# Patient Record
Sex: Female | Born: 1950 | Race: White | Hispanic: No | Smoking: Never smoker
Health system: Southern US, Community
[De-identification: ages and names within clinical notes are randomized; demographics above are authoritative.]

## PROBLEM LIST (undated history)

## (undated) DIAGNOSIS — I10 Essential (primary) hypertension: Secondary | ICD-10-CM

## (undated) DIAGNOSIS — I499 Cardiac arrhythmia, unspecified: Secondary | ICD-10-CM

## (undated) DIAGNOSIS — M1990 Unspecified osteoarthritis, unspecified site: Secondary | ICD-10-CM

## (undated) DIAGNOSIS — J45909 Unspecified asthma, uncomplicated: Secondary | ICD-10-CM

---

## 2018-11-07 HISTORY — PX: COLONOSCOPY: SHX174

## 2021-08-18 ENCOUNTER — Ambulatory Visit (INDEPENDENT_AMBULATORY_CARE_PROVIDER_SITE_OTHER): Payer: Medicare Other | Admitting: Orthopedic Surgery

## 2021-08-18 ENCOUNTER — Encounter: Payer: Self-pay | Admitting: Orthopedic Surgery

## 2021-08-18 ENCOUNTER — Ambulatory Visit: Payer: Self-pay

## 2021-08-18 ENCOUNTER — Other Ambulatory Visit: Payer: Self-pay

## 2021-08-18 DIAGNOSIS — G8929 Other chronic pain: Secondary | ICD-10-CM

## 2021-08-18 DIAGNOSIS — M25511 Pain in right shoulder: Secondary | ICD-10-CM

## 2021-08-18 NOTE — Progress Notes (Signed)
Office Visit Note   Patient: Elizabeth [name]           Date of Birth: 1951-07-11           MRN: [id number] Visit Date: 08/18/2021 Requested by: Rolan Bucco., PA-C [address] [place],  [place] [address] PCP: Rolan Bucco., PA-C  Subjective: Chief Complaint  Patient presents with   Right Shoulder - Pain    HPI: Elizabeth [name] is a 70 year old patient with right shoulder pain.  She has had shoulder pain for about a year and a half.  Pain is worse in the right-hand side but she also has some left shoulder symptoms.  Hard for her to get her hands up behind her head.  Hard for her to reach dishes in the cabinet.  She states her range of motion is limited by pain.  She tried anti-inflammatories but it increased her blood pressure.  Denies any neck pain or radicular symptoms.  Heating pad gives her some relief in the morning.  She also has used Voltaren gel.  She has not had any injections.  Rates the pain as 9 out of 10 during the day.  She does have her granddaughter at home with her.              ROS: All systems reviewed are negative as they relate to the chief complaint within the history of present illness.  Patient denies  fevers or chills.   Assessment & Plan: Visit Diagnoses:  1. 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  Hard to accurately assess her strength due to the loss of motion.  I do not think this is a type of shoulder that would do well for any length of time with an injection.  Discussed operative and nonoperative treatment options for low this.  In general we need a thin cut CT scan to evaluate whether or not there is enough glenoid bone stock to perform reverse replacement.  She is otherwise healthy.  Thin cut CT scan pending for patient specific instrumentation for reverse shoulder replacement.  Follow-up after that study.  Follow-Up Instructions: Return for after MRI.   Orders:  Orders  Placed This Encounter  Procedures   XR Shoulder Right   CT SHOULDER RIGHT WO CONTRAST   No orders of the defined types were placed in this encounter.     Procedures: No procedures performed   Clinical Data: No additional findings.  Objective: Vital Signs: There were no vitals taken for this visit.  Physical Exam:   Constitutional: Patient appears well-developed HEENT:  Head: Normocephalic Eyes:EOM are normal Neck: Normal range of motion Cardiovascular: Normal rate Pulmonary/chest: Effort normal Neurologic: Patient is alert Skin: Skin is warm Psychiatric: Patient has normal mood and affect   Ortho Exam: Ortho exam demonstrates full active and passive range of motion of the cervical spine.  5 out of 5 grip EPL FPL interosseous wrist flexion extension bicep triceps and deltoid strength.  Passive range of motion on the right is 15/40/80.  Passive range of motion on the left is 25/50/90.  Rotator cuff strength is difficult to assess on the right but feels reasonably intact.  No masses lymphadenopathy or skin changes noted in that shoulder girdle region.  Radial pulse is intact.  Specialty Comments:  No specialty comments available.  Imaging: No results found.   PMFS History: There are no problems to display for this patient.  History  reviewed. No pertinent past medical history.  History reviewed. No pertinent family history.  History reviewed. No pertinent surgical history. Social History   Occupational History   Not on file  Tobacco Use   Smoking status: Not on file   Smokeless tobacco: Not on file  Substance and Sexual Activity   Alcohol use: Not on file   Drug use: Not on file   Sexual activity: Not on file

## 2021-09-09 ENCOUNTER — Ambulatory Visit
Admission: RE | Admit: 2021-09-09 | Discharge: 2021-09-09 | Disposition: A | Discharge status: Home / Self Care | Payer: Medicare Other | Source: Ambulatory Visit | Attending: Orthopedic Surgery | Admitting: Orthopedic Surgery

## 2021-09-09 ENCOUNTER — Other Ambulatory Visit: Payer: Self-pay

## 2021-09-09 DIAGNOSIS — M25511 Pain in right shoulder: Secondary | ICD-10-CM

## 2021-09-13 ENCOUNTER — Ambulatory Visit (INDEPENDENT_AMBULATORY_CARE_PROVIDER_SITE_OTHER): Payer: Medicare Other | Admitting: Orthopedic Surgery

## 2021-09-13 ENCOUNTER — Other Ambulatory Visit: Payer: Self-pay

## 2021-09-13 DIAGNOSIS — M19011 Primary osteoarthritis, right shoulder: Secondary | ICD-10-CM | POA: Diagnosis not present

## 2021-09-19 ENCOUNTER — Encounter: Payer: Self-pay | Admitting: Orthopedic Surgery

## 2021-09-19 NOTE — Progress Notes (Signed)
   Office Visit Note   Patient: Elizabeth [name]           Date of Birth: 1951-10-09           MRN: [id number] Visit Date: 09/13/2021 Requested by: Rolan Bucco., PA-C [address] [place],  [place] [address] PCP: Rolan Bucco., PA-C  Subjective: Chief Complaint  Patient presents with   Other     Scan review    HPI: Patient presents for evaluation of right shoulder pain.  Since she was last seen she has had a CT scan of the right shoulder which shows severe arthritis along with narrowing of the acromiohumeral distance consistent with end-stage rotator cuff arthropathy.  She has limited range of motion of the shoulder as well.  She stays with her granddaughter but most of the time she is not there.  Overall she is functional enough with the shoulder that she wants to continue with that amount of functional disability.              ROS: All systems reviewed are negative as they relate to the chief complaint within the history of present illness.  Patient denies  fevers or chills.   Assessment & Plan: Visit Diagnoses:  1. 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  The risk and benefits of that procedure are discussed with the patient including not limited to infection nerve vessel damage instability as well as the prolonged recovery required.  In general patient has significant functional limitation with the shoulder but is managing well enough that she wants to continue without intervention for now.  She may change her mind.  Encouraged her to come back if she wanted to discuss more about reverse shoulder replacement.  All questions answered about the procedure.  Follow-Up Instructions: No follow-ups on file.   Orders:  No orders of the defined types were placed in this encounter.  No orders of the defined types were placed in this  encounter.     Procedures: No procedures performed   Clinical Data: No additional findings.  Objective: Vital Signs: There were no vitals taken for this visit.  Physical Exam:   Constitutional: Patient appears well-developed HEENT:  Head: Normocephalic Eyes:EOM are normal Neck: Normal range of motion Cardiovascular: Normal rate Pulmonary/chest: Effort normal Neurologic: Patient is alert Skin: Skin is warm Psychiatric: Patient has normal mood and affect   Ortho Exam: Ortho exam demonstrates functional deltoid but limited range of motion.  This is unchanged from the prior note.  Right shoulder has crepitus with range of motion.  Motor or sensory function to the hand is intact.  No other masses lymphadenopathy or skin changes noted in the shoulder girdle region.  Specialty Comments:  No specialty comments available.  Imaging: No results found.   PMFS History: There are no problems to display for this patient.  No past medical history on file.  No family history on file.  No past surgical history on file. Social History   Occupational History   Not on file  Tobacco Use   Smoking status: Not on file   Smokeless tobacco: Not on file  Substance and Sexual Activity   Alcohol use: Not on file   Drug use: Not on file   Sexual activity: Not on file

## 2022-01-26 NOTE — Progress Notes (Signed)
Surgical Instructions ? ? ? Your procedure is scheduled on Thursday, March 30th. ? Report to [facility] Main Entr[facility] "A" at 10:15 A.M., then check in with the Admitting office. ? Call this number if you have problems the morning of surgery: ? [phone] ? ? If you have any questions prior to your surgery date call [phone]: Open Monday-Friday 8am-4pm ? ? ? Remember: ? Do not eat after midnight the night before your surgery ? ?You may drink clear liquids until 9:15 AM the morning of your surgery.   ?Clear liquids allowed are: Water, Non-Citrus Juices (without pulp), Carbonated Beverages, Clear Tea, Black Coffee ONLY (NO MILK, CREAM OR POWDERED CREAMER of any kind), and Gatorade ? ?Please complete your PRE-SURGERY ENSURE that was provided to you by 9:15 AM the morning of surgery.  Please, if able, drink it in one sitting. DO NOT SIP. Nothing else to drink once you finish the Ensure.  ? ?  ? Take these medicines the morning of surgery with A SIP OF WATER:  ? NONE ? ? ?As of today, STOP taking any Aspirin (unless otherwise instructed by your surgeon) Aleve, Naproxen, Ibuprofen, Motrin, Advil, Goody's, BC's, all herbal medications, fish oil, and all vitamins. ? ?         ? DAY OF SURGERY: ?Do not wear jewelry or makeup ?Do not wear lotions, powders, perfumes, or deodorant. ?Do not shave 48 hours prior to surgery.   ?Do not bring valuables to the hospital. ?Do not wear nail polish, gel polish, artificial nails, or any other type of covering on natural nails (fingers and toes) ?If you have artificial nails or gel coating that need to be removed by a nail salon, please have this removed prior to surgery. Artificial nails or gel coating may interfere with anesthesia's ability to adequately monitor your vital signs. ? ?[facility] is not responsible for any belongings or valuables. .  ? ?Do NOT Smoke (Tobacco/Vaping)  24 hours prior to your procedure ? ?If you use a CPAP at night, you may bring your mask for your  overnight stay. ?  ?Contacts, glasses, hearing aids, dentures or partials may not be worn into surgery, please bring cases for these belongings ?  ?For patients admitted to the hospital, discharge time will be determined by your treatment team. ?  ?Patients discharged the day of surgery will not be allowed to drive home, and someone needs to stay with them for 24 hours. ? ? ?SURGICAL WAITING ROOM VISITATION ?Patients having surgery or a procedure in a hospital may have two support people. ?Children under the age of 69 must have an adult with them who is not the patient. ?They may stay in the waiting area during the procedure and may switch out with other visitors. If the patient needs to stay at the hospital during part of their recovery, the visitor guidelines for inpatient rooms apply. ? ?Please refer to the [facility] website for the visitor guidelines for Inpatients (after your surgery is over and you are in a regular room).  ? ? ?                                 [facility]- Preparing for Total Shoulder Arthroplasty  ? ?Before surgery, you can play an important role. Because skin is not sterile, your skin needs to be as free of germs as possible. You can reduce the number of germs on your skin  by using the following products. ?Benzoyl Peroxide Gel ?Reduces the number of germs present on the skin ?Applied twice a day to shoulder area starting two days before surgery   ?Chlorhexidine Gluconate (CHG) Soap ?An antiseptic cleaner that kills germs and bonds with the skin to continue killing germs even after washing ?Used for showering the night before surgery and morning of surgery ?  ?Oral Hygiene is also important to reduce your risk of infection.                                    ?Remember - BRUSH YOUR TEETH THE MORNING OF SURGERY WITH YOUR REGULAR TOOTHPASTE ? ?================================================================== ? ?Please follow these instructions carefully: ? ?BENZOYL PEROXIDE 5% GEL ? ?Please do  not use if you have an allergy to benzoyl peroxide.   If your skin becomes reddened/irritated stop using the benzoyl peroxide. ? ?Starting two days before surgery, apply as follows: ?Apply benzoyl peroxide in the morning and at night. Apply after taking a shower. If you are not taking a shower clean entire shoulder front, back, and side along with the armpit with a clean wet washcloth. ? ?Place a quarter-sized dollop on your shoulder and rub in thoroughly, making sure to cover the front, back, and side of your shoulder, along with the armpit.  ? ?2 days before ____ AM   ____ PM              1 day before ____ AM   ____ PM ? ?                        ? Do this twice a day for two days.  (Last application is the night before surgery, AFTER using the CHG soap as described below). ? ?Do NOT apply benzoyl peroxide gel on the day of surgery. ? ?CHLORHEXIDINE GLUCONATE (CHG) SOAP ? ?Please do not use if you have an allergy to CHG or antibacterial soaps. If your skin becomes reddened/irritated stop using the CHG.  ? ?Do not shave (including legs and underarms) for at least 48 hours prior to first CHG shower. It is OK to shave your face. ? ?Starting the night before surgery, use CHG soap as follows: ? ?Shower the NIGHT BEFORE SURGERY and MORNING OF SURGERY with CHG. ? ?If you choose to wash your hair, wash your hair first as usual with your normal shampoo. ? ?After shampooing, rinse your hair and body thoroughly to remove the shampoo. ? ?Use CHG as you would any other liquid soap.  You can apply CHG directly to the skin and wash gently with a scrungie or a clean washcloth. ? ?Apply the CHG soap to your body ONLY FROM THE NECK DOWN.  Do not use on open wounds or open sores.  Avoid contact with your eyes, ears, mouth, and genitals (private parts).  Wash face and genitals (private parts) with your normal soap. ? ?Wash thoroughly, paying special attention to the area where your surgery will be performed. ? ?Thoroughly rinse your  body with warm water from the neck down. ? ?DO NOT shower/wash with your normal soap after using and rinsing off the CHG soap. ? ? ?Pat yourself dry with a CLEAN TOWEL.  ? ? Apply benzoyl peroxide.  ? ?Wear CLEAN PAJAMAS to bed the night before surgery; wear comfortable clothes the morning of surgery. ? ?Place CLEAN SHEETS on your  bed the night of your first shower and DO NOT SLEEP WITH PETS. ? ?Day of Surgery: ?Shower as above ?Do not apply any deodorants/lotions.  ?Please wear clean clothes to the hospital/surgery center.   ?Remember to brush your teeth WITH YOUR REGULAR TOOTHPASTE.   ? ? ?If you received a COVID test during your pre-op visit  it is requested that you wear a mask when out in public, stay away from anyone that may not be feeling well and notify your surgeon if you develop symptoms. If you have been in contact with anyone that has tested positive in the last 10 days please notify you surgeon. ? ?  ?Please read over the following fact sheets that you were given.  ? ?

## 2022-01-27 ENCOUNTER — Inpatient Hospital Stay (HOSPITAL_COMMUNITY)
Admission: RE | Admit: 2022-01-27 | Discharge: 2022-01-27 | Disposition: A | Discharge status: Home / Self Care | Payer: Medicare Other | Source: Ambulatory Visit

## 2022-02-03 ENCOUNTER — Ambulatory Visit (HOSPITAL_COMMUNITY): Admission: RE | Admit: 2022-02-03 | Payer: Medicare Other | Source: Home / Self Care | Admitting: Orthopedic Surgery

## 2022-02-03 ENCOUNTER — Encounter (HOSPITAL_COMMUNITY): Admission: RE | Payer: Self-pay | Source: Home / Self Care

## 2022-02-03 DIAGNOSIS — Z01818 Encounter for other preprocedural examination: Secondary | ICD-10-CM

## 2022-02-03 SURGERY — ARTHROPLASTY, SHOULDER, TOTAL, REVERSE
Anesthesia: General | Site: Shoulder | Laterality: Right

## 2022-02-17 ENCOUNTER — Encounter: Payer: Medicare Other | Admitting: Orthopedic Surgery

## 2022-04-01 ENCOUNTER — Telehealth: Payer: Self-pay | Admitting: Orthopedic Surgery

## 2022-04-01 NOTE — Telephone Encounter (Signed)
Patient's right reverse shoulder arthroplasty for 04-12-22 with Dr. August Saucer has been cancelled.  The implant model has expired. Surgery would need to have taken place by 04-11-22 in order to proceed with surgery.  I spoke with patient and she  has decided on a  rescheduled date of 07-07-22. A new referral for a scan will need to placed.  Can you please let me know when this has been done, so I can contact [employer] to make sure the images are pushed to Josh.

## 2022-04-05 ENCOUNTER — Other Ambulatory Visit: Payer: Self-pay

## 2022-04-05 DIAGNOSIS — M19011 Primary osteoarthritis, right shoulder: Secondary | ICD-10-CM

## 2022-04-06 ENCOUNTER — Other Ambulatory Visit (HOSPITAL_COMMUNITY): Payer: Medicare Other

## 2022-04-22 ENCOUNTER — Other Ambulatory Visit: Payer: Medicare Other

## 2022-04-27 ENCOUNTER — Encounter: Payer: Medicare Other | Admitting: Orthopedic Surgery

## 2022-05-04 ENCOUNTER — Other Ambulatory Visit: Payer: Self-pay | Admitting: Orthopedic Surgery

## 2022-05-04 ENCOUNTER — Ambulatory Visit
Admission: RE | Admit: 2022-05-04 | Discharge: 2022-05-04 | Disposition: A | Discharge status: Home / Self Care | Payer: Medicare Other | Source: Ambulatory Visit | Attending: Orthopedic Surgery | Admitting: Orthopedic Surgery

## 2022-05-04 DIAGNOSIS — M19011 Primary osteoarthritis, right shoulder: Secondary | ICD-10-CM

## 2022-05-09 NOTE — Progress Notes (Signed)
Hi Debbie can you get a date for this person since the scan was done.  Thanks

## 2022-05-20 ENCOUNTER — Encounter: Payer: Medicare Other | Admitting: Orthopedic Surgery

## 2022-06-07 ENCOUNTER — Other Ambulatory Visit: Payer: Self-pay

## 2022-07-05 ENCOUNTER — Encounter (HOSPITAL_COMMUNITY): Payer: Self-pay

## 2022-07-05 ENCOUNTER — Other Ambulatory Visit: Payer: Self-pay

## 2022-07-05 ENCOUNTER — Encounter (HOSPITAL_COMMUNITY)
Admission: RE | Admit: 2022-07-05 | Discharge: 2022-07-05 | Disposition: A | Discharge status: Home / Self Care | Payer: Medicare Other | Source: Ambulatory Visit | Attending: Orthopedic Surgery | Admitting: Orthopedic Surgery

## 2022-07-05 VITALS — BP 155/85 | HR 75 | Temp 98.3°F | Resp 17 | Ht 67.0 in | Wt 199.9 lb

## 2022-07-05 DIAGNOSIS — Z01818 Encounter for other preprocedural examination: Secondary | ICD-10-CM | POA: Insufficient documentation

## 2022-07-05 DIAGNOSIS — I2510 Atherosclerotic heart disease of native coronary artery without angina pectoris: Secondary | ICD-10-CM | POA: Insufficient documentation

## 2022-07-05 HISTORY — DX: Essential (primary) hypertension: I10

## 2022-07-05 HISTORY — DX: Cardiac arrhythmia, unspecified: I49.9

## 2022-07-05 HISTORY — DX: Unspecified asthma, uncomplicated: J45.909

## 2022-07-05 HISTORY — DX: Unspecified osteoarthritis, unspecified site: M19.90

## 2022-07-05 LAB — CBC
HCT: 38.1 % (ref 36.0–46.0)
Hemoglobin: 12.6 g/dL (ref 12.0–15.0)
MCH: 32 pg (ref 26.0–34.0)
MCHC: 33.1 g/dL (ref 30.0–36.0)
MCV: 96.7 fL (ref 80.0–100.0)
Platelets: 350 10*3/uL (ref 150–400)
RBC: 3.94 MIL/uL (ref 3.87–5.11)
RDW: 12.2 % (ref 11.5–15.5)
WBC: 8.9 10*3/uL (ref 4.0–10.5)
nRBC: 0 % (ref 0.0–0.2)

## 2022-07-05 LAB — BASIC METABOLIC PANEL
Anion gap: 6 (ref 5–15)
BUN: 15 mg/dL (ref 8–23)
CO2: 26 mmol/L (ref 22–32)
Calcium: 9 mg/dL (ref 8.9–10.3)
Chloride: 107 mmol/L (ref 98–111)
Creatinine, Ser: 0.89 mg/dL (ref 0.44–1.00)
GFR, Estimated: >60 mL/min (ref >60)
Glucose, Bld: 88 mg/dL (ref 70–99)
Potassium: 4.1 mmol/L (ref 3.5–5.1)
Sodium: 139 mmol/L (ref 135–145)

## 2022-07-05 LAB — SURGICAL PCR SCREEN
MRSA, PCR: NEGATIVE
Staphylococcus aureus: NEGATIVE

## 2022-07-05 NOTE — Progress Notes (Signed)
PCP - Osborne Casco, PA with [facility] Cardiologist - Denies  PPM/ICD - Denies Device Orders - n/a Rep Notified - n/a  Chest x-ray - n/a EKG - 07/05/2022 Stress Test - n/a ECHO - n/a Cardiac Cath - n/a   Sleep Study - Denies CPAP - n/a  No DM  Blood Thinner Instructions: n/a Aspirin Instructions: n/a  ERAS Protcol - Yes. Clear liquids until 0915 morning of surgery PRE-SURGERY Ensure or G2- n/a. None ordered  COVID TEST- n/a   Anesthesia review: Yes. Abnormal EKG. Discussed with Shonna Chock, PA-C  Patient denies shortness of breath, fever, cough and chest pain at PAT appointment   All instructions explained to the patient, with a verbal understanding of the material. Patient agrees to go over the instructions while at home for a better understanding. Patient also instructed to self quarantine after being tested for COVID-19. The opportunity to ask questions was provided.

## 2022-07-05 NOTE — Progress Notes (Signed)
Surgical Instructions    Your procedure is scheduled on Thursday, 07/07/22.  Report to [facility] Main Entrance "A" at 10:15 A.M., then check in with the Admitting office.  Call this number if you have problems the morning of surgery:  [phone]   If you have any questions prior to your surgery date call [phone]: Open Monday-Friday 8am-4pm    Remember:  Do not eat after midnight the night before your surgery  You may drink clear liquids until 9:15am the morning of your surgery.   Clear liquids allowed are: Water, Non-Citrus Juices (without pulp), Carbonated Beverages, Clear Tea, Black Coffee ONLY (NO MILK, CREAM OR POWDERED CREAMER of any kind), and Gatorade    Take these medicines the morning of surgery with A SIP OF WATER:  loratadine (CLARITIN) if needed   As of today, STOP taking any Aspirin (unless otherwise instructed by your surgeon) Aleve, Naproxen, Ibuprofen, Motrin, Advil, Goody's, BC's, all herbal medications, fish oil, and all vitamins.           Do not wear jewelry or makeup. Do not wear lotions, powders, perfumes/cologne or deodorant. Do not shave 48 hours prior to surgery.   Do not bring valuables to the hospital. Do not wear nail polish, gel polish, artificial nails, or any other type of covering on natural nails (fingers and toes) If you have artificial nails or gel coating that need to be removed by a nail salon, please have this removed prior to surgery. Artificial nails or gel coating may interfere with anesthesia's ability to adequately monitor your vital signs.  [facility] is not responsible for any belongings or valuables.    Do NOT Smoke (Tobacco/Vaping)  24 hours prior to your procedure  If you use a CPAP at night, you may bring your mask for your overnight stay.   Contacts, glasses, hearing aids, dentures or partials may not be worn into surgery, please bring cases for these belongings   For patients admitted to the hospital, discharge time  will be determined by your treatment team.   Patients discharged the day of surgery will not be allowed to drive home, and someone needs to stay with them for 24 hours.   SURGICAL WAITING ROOM VISITATION Patients having surgery or a procedure may have no more than 2 support people in the waiting area - these visitors may rotate.   Children under the age of 16 must have an adult with them who is not the patient. If the patient needs to stay at the hospital during part of their recovery, the visitor guidelines for inpatient rooms apply. Pre-op nurse will coordinate an appropriate time for 1 support person to accompany patient in pre-op.  This support person may not rotate.   Please refer to the [facility] website for the visitor guidelines for Inpatients (after your surgery is over and you are in a regular room).    Special instructions:    Oral Hygiene is also important to reduce your risk of infection.  Remember - BRUSH YOUR TEETH THE MORNING OF SURGERY WITH YOUR REGULAR TOOTHPASTE   Oral Hygiene is also important to reduce your risk of infection.  Remember - BRUSH YOUR TEETH THE MORNING OF SURGERY WITH YOUR REGULAR TOOTHPASTE  [facility]- Preparing for Total Shoulder Arthroplasty  Before surgery, you can play an important role. Because skin is not sterile, your skin needs to be as free of germs as possible. You can reduce the number of germs on your skin by using the  following products.   Benzoyl Peroxide Gel  o Reduces the number of germs present on the skin  o Applied twice a day to shoulder area starting two days before surgery   Chlorhexidine Gluconate (CHG) Soap (instructions listed above on how to wash with CHG Soap)  o An antiseptic cleaner that kills germs and bonds with the skin to continue killing germs even after washing  o Used for showering the night before surgery and morning of  surgery   ==================================================================  Please follow these instructions carefully:  BENZOYL PEROXIDE 5% GEL  Please do not use if you have an allergy to benzoyl peroxide. If your skin becomes reddened/irritated stop using the benzoyl peroxide.  Starting two days before surgery, apply as follows:  1. Apply benzoyl peroxide in the morning and at night. Apply after taking a shower. If you are not taking a shower clean entire shoulder front, back, and side along with the armpit with a clean wet washcloth.  2. Place a quarter-sized dollop on your SHOULDER and rub in thoroughly, making sure to cover the front, back, and side of your shoulder, along with the armpit.   2 Days prior to Surgery First Dose on Tuesday 07/05/22 Morning Second Dose on Tuesday 8/29/23Night  Day Before Surgery First Dose on Wednesday 07/06/22 Morning Night before surgery wash (entire body except face and private areas) with CHG Soap THEN Second Dose on Wednesday 07/06/22 Night.  Morning of Surgery  wash BODY AGAIN with CHG Soap   4. Do NOT apply benzoyl peroxide gel on the day of surgery   [facility]- Preparing For Surgery  Before surgery, you can play an important role. Because skin is not sterile, your skin needs to be as free of germs as possible. You can reduce the number of germs on your skin by washing with CHG (chlorahexidine gluconate) Soap before surgery.  CHG is an antiseptic cleaner which kills germs and bonds with the skin to continue killing germs even after washing.     Please do not use if you have an allergy to CHG or antibacterial soaps. If your skin becomes reddened/irritated stop using the CHG.  Do not shave (including legs and underarms) for at least 48 hours prior to first CHG shower. It is OK to shave your face.  Please follow these instructions carefully.     Shower the NIGHT BEFORE SURGERY and the MORNING OF SURGERY with CHG Soap.   If you  chose to wash your hair, wash your hair first as usual with your normal shampoo. After you shampoo, rinse your hair and body thoroughly to remove the shampoo.  Then [employer] and genitals (private parts) with your normal soap and rinse thoroughly to remove soap.  After that Use CHG Soap as you would any other liquid soap. You can apply CHG directly to the skin and wash gently with a scrungie or a clean washcloth.   Apply the CHG Soap to your body ONLY FROM THE NECK DOWN.  Do not use on open wounds or open sores. Avoid contact with your eyes, ears, mouth and genitals (private parts). Wash Face and genitals (private parts)  with your normal soap.   Wash thoroughly, paying special attention to the area where your surgery will be performed.  Thoroughly rinse your body with warm water from the neck down.  DO NOT shower/wash with your normal soap after using and rinsing off the CHG Soap.  Pat yourself dry with a CLEAN TOWEL.  8. Apply  the Benzoyl Peroxide only the night before surgery.  Do Not use it the morning of surgery.  Wear CLEAN PAJAMAS to bed the night before surgery  Place CLEAN SHEETS on your bed the night before your surgery  DO NOT SLEEP WITH PETS.   Day of Surgery: Take a shower with CHG soap. Wear Clean/Comfortable clothing the morning of surgery Do not apply any deodorants/lotions.   Remember to brush your teeth WITH YOUR REGULAR TOOTHPASTE.   Please read over the following fact sheets that you were given.

## 2022-07-06 ENCOUNTER — Encounter (HOSPITAL_COMMUNITY): Payer: Self-pay

## 2022-07-06 NOTE — Anesthesia Preprocedure Evaluation (Addendum)
Anesthesia Evaluation  Patient identified by MRN, date of birth, ID band Patient awake    Reviewed: Allergy & Precautions, NPO status , Patient's Chart, lab work & pertinent test results  Airway Mallampati: I  TM Distance: >3 FB Neck ROM: Full    Dental no notable dental hx. (+) Missing, Partial Lower, Partial Upper,    Pulmonary neg pulmonary ROS,    Pulmonary exam normal breath sounds clear to auscultation       Cardiovascular hypertension (not currently on home meds- 170/91 in preop), Normal cardiovascular exam Rhythm:Regular Rate:Normal     Neuro/Psych negative neurological ROS  negative psych ROS   GI/Hepatic negative GI ROS, Neg liver ROS,   Endo/Other  negative endocrine ROS  Renal/GU negative Renal ROS  negative genitourinary   Musculoskeletal  (+) Arthritis , Osteoarthritis,    Abdominal   Peds  Hematology negative hematology ROS (+)   Anesthesia Other Findings   Reproductive/Obstetrics negative OB ROS                           Anesthesia Physical Anesthesia Plan  ASA: 3  Anesthesia Plan: General and Regional   Post-op Pain Management: Regional block* and Tylenol PO (pre-op)*   Induction: Intravenous  PONV Risk Score and Plan: 3 and Ondansetron, Dexamethasone, Midazolam and Treatment may vary due to age or medical condition  Airway Management Planned: Oral ETT  Additional Equipment: None  Intra-op Plan:   Post-operative Plan: Extubation in OR  Informed Consent: I have reviewed the patients History and Physical, chart, labs and discussed the procedure including the risks, benefits and alternatives for the proposed anesthesia with the patient or authorized representative who has indicated his/her understanding and acceptance.     Dental advisory given  Plan Discussed with: CRNA  Anesthesia Plan Comments: (Very hypertensive in preop (SBP 170s), pt doesn't know why  she stopped her BP meds- advised her that she should start taking them again and f/u with PCP postop)     Anesthesia Quick Evaluation

## 2022-07-06 NOTE — Progress Notes (Signed)
Anesthesia Chart Review:  Case: [id number] Date/Time: 07/07/22 1200   Procedure: RIGHT REVERSE SHOULDER ARTHROPLASTY (Right: Shoulder)   Anesthesia type: General   Pre-op diagnosis: right shoulder osteoarthritis   [facility]: [facility] / [facility]   Surgeons: Cammy Copa, MD       DISCUSSION: Patient is a 71 year old female scheduled for the above procedure.  History includes never smoker, HTN, childhood asthma, osteoarthritis. 07/05/22 EKG showed NSR with sinus arrhythmia, non-specific ST abnormality. Patient denied shortness of breath, fever, cough and chest pain per PAT RN interview.   Anesthesia team to evaluate on the day of surgery.     VS: BP (!) 155/85   Pulse 75   Temp 36.8 C (Oral)   Resp 17   Ht 5\' 7"  (1.702 m)   Wt 90.7 kg   SpO2 99%   BMI 31.31 kg/m   PROVIDERS: ., PA-C is PCP [facility])   LABS: Labs reviewed: Acceptable for surgery. (all labs ordered are listed, but only abnormal results are displayed)  Labs Reviewed  SURGICAL PCR SCREEN  BASIC METABOLIC PANEL  CBC     IMAGES: CT Right Shoulder 05/04/22: IMPRESSION: 1. Advanced glenohumeral osteoarthritis with loss of articular cartilage, bone-on-bone articulation, subchondral sclerosis and bulky marginal osteophytes. 2. Full-thickness tear of the supraspinatus tendon and at least full-thickness partial width tear of the infraspinatus tendon. 3. Mild supraspinatus muscle atrophy. No appreciable atrophy of the deltoid. 4.  Moderate acromioclavicular osteoarthritis.   EKG: 07/05/22: Normal sinus rhythm with sinus arrhythmia Nonspecific ST abnormality Abnormal ECG No previous ECGs available Confirmed by 07/07/22 [phone]) on 07/06/2022 7:01:46 AM   CV: N/A  Past Medical History:  Diagnosis Date   Arthritis    Right Shoulder   Asthma    as a child   Dysrhythmia    Hypertension    Hx. Not current per pt    Past Surgical History:  Procedure  Laterality Date   COLONOSCOPY  2020    MEDICATIONS:  lisinopril-hydrochlorothiazide (ZESTORETIC) 20-12.5 MG tablet   loratadine (CLARITIN) 10 MG tablet   Multiple Vitamin (MULTIVITAMIN WITH MINERALS) TABS tablet   No current facility-administered medications for this encounter.    2021, PA-C Surgical Short Stay/Anesthesiology [facility] Phone [phone] [facility] Phone [phone] 07/06/2022 11:24 AM

## 2022-07-07 ENCOUNTER — Encounter (HOSPITAL_COMMUNITY)
Admission: RE | Disposition: A | Discharge status: Home / Self Care | Payer: Self-pay | Source: Home / Self Care | Attending: Orthopedic Surgery

## 2022-07-07 ENCOUNTER — Ambulatory Visit (HOSPITAL_BASED_OUTPATIENT_CLINIC_OR_DEPARTMENT_OTHER): Payer: Medicare Other | Admitting: Anesthesiology

## 2022-07-07 ENCOUNTER — Other Ambulatory Visit: Payer: Self-pay

## 2022-07-07 ENCOUNTER — Ambulatory Visit (HOSPITAL_COMMUNITY): Payer: Medicare Other | Admitting: Vascular Surgery

## 2022-07-07 ENCOUNTER — Observation Stay (HOSPITAL_COMMUNITY)
Admission: RE | Admit: 2022-07-07 | Discharge: 2022-07-08 | Disposition: A | Discharge status: Home / Self Care | Payer: Medicare Other | Attending: Orthopedic Surgery | Admitting: Orthopedic Surgery

## 2022-07-07 ENCOUNTER — Observation Stay (HOSPITAL_COMMUNITY): Payer: Medicare Other

## 2022-07-07 ENCOUNTER — Encounter (HOSPITAL_COMMUNITY): Payer: Self-pay | Admitting: Orthopedic Surgery

## 2022-07-07 DIAGNOSIS — J45909 Unspecified asthma, uncomplicated: Secondary | ICD-10-CM | POA: Insufficient documentation

## 2022-07-07 DIAGNOSIS — I2510 Atherosclerotic heart disease of native coronary artery without angina pectoris: Secondary | ICD-10-CM

## 2022-07-07 DIAGNOSIS — M19011 Primary osteoarthritis, right shoulder: Secondary | ICD-10-CM | POA: Diagnosis present

## 2022-07-07 DIAGNOSIS — Z96611 Presence of right artificial shoulder joint: Secondary | ICD-10-CM

## 2022-07-07 DIAGNOSIS — Z01818 Encounter for other preprocedural examination: Secondary | ICD-10-CM

## 2022-07-07 DIAGNOSIS — I10 Essential (primary) hypertension: Secondary | ICD-10-CM | POA: Diagnosis not present

## 2022-07-07 DIAGNOSIS — Z79899 Other long term (current) drug therapy: Secondary | ICD-10-CM | POA: Diagnosis not present

## 2022-07-07 HISTORY — PX: REVERSE SHOULDER ARTHROPLASTY: SHX5054

## 2022-07-07 LAB — BASIC METABOLIC PANEL
Anion gap: 10 (ref 5–15)
BUN: 10 mg/dL (ref 8–23)
CO2: 25 mmol/L (ref 22–32)
Calcium: 8.9 mg/dL (ref 8.9–10.3)
Chloride: 103 mmol/L (ref 98–111)
Creatinine, Ser: 0.66 mg/dL (ref 0.44–1.00)
GFR, Estimated: >60 mL/min (ref >60)
Glucose, Bld: 172 mg/dL — ABNORMAL HIGH (ref 70–99)
Potassium: 3.9 mmol/L (ref 3.5–5.1)
Sodium: 138 mmol/L (ref 135–145)

## 2022-07-07 LAB — CBC
HCT: 38.9 % (ref 36.0–46.0)
Hemoglobin: 12.8 g/dL (ref 12.0–15.0)
MCH: 32.3 pg (ref 26.0–34.0)
MCHC: 32.9 g/dL (ref 30.0–36.0)
MCV: 98.2 fL (ref 80.0–100.0)
Platelets: 332 K/uL (ref 150–400)
RBC: 3.96 MIL/uL (ref 3.87–5.11)
RDW: 12.2 % (ref 11.5–15.5)
WBC: 15.1 K/uL — ABNORMAL HIGH (ref 4.0–10.5)
nRBC: 0 % (ref 0.0–0.2)

## 2022-07-07 SURGERY — ARTHROPLASTY, SHOULDER, TOTAL, REVERSE
Anesthesia: Regional | Site: Shoulder | Laterality: Right

## 2022-07-07 MED ORDER — CHLORHEXIDINE GLUCONATE 0.12 % MT SOLN
15.0000 mL | Freq: Once | OROMUCOSAL | Status: AC
  Administered 2022-07-07: 15 mL via OROMUCOSAL
  Filled 2022-07-07: qty 15

## 2022-07-07 MED ORDER — POVIDONE-IODINE 7.5 % EX SOLN
Freq: Once | CUTANEOUS | Status: DC
  Filled 2022-07-07: qty 118

## 2022-07-07 MED ORDER — HYDROMORPHONE HCL 1 MG/ML IJ SOLN: 0.5000 mg | INTRAMUSCULAR | Status: DC | PRN

## 2022-07-07 MED ORDER — METHOCARBAMOL 500 MG PO TABS
500.0000 mg | ORAL_TABLET | Freq: Four times a day (QID) | ORAL | Status: DC | PRN
  Administered 2022-07-07 – 2022-07-08 (×2): 500 mg via ORAL
  Filled 2022-07-07 (×2): qty 1

## 2022-07-07 MED ORDER — LIDOCAINE 2% (20 MG/ML) 5 ML SYRINGE
INTRAMUSCULAR | Status: DC | PRN
  Administered 2022-07-07: 40 mg via INTRAVENOUS

## 2022-07-07 MED ORDER — DEXAMETHASONE SODIUM PHOSPHATE 10 MG/ML IJ SOLN
INTRAMUSCULAR | Status: DC | PRN
  Administered 2022-07-07: 10 mg via INTRAVENOUS

## 2022-07-07 MED ORDER — TRANEXAMIC ACID-NACL 1000-0.7 MG/100ML-% IV SOLN
1000.0000 mg | INTRAVENOUS | Status: AC
  Administered 2022-07-07: 1000 mg via INTRAVENOUS
  Filled 2022-07-07: qty 100

## 2022-07-07 MED ORDER — ORAL CARE MOUTH RINSE: 15.0000 mL | Freq: Once | OROMUCOSAL | Status: AC

## 2022-07-07 MED ORDER — PROPOFOL 10 MG/ML IV BOLUS
INTRAVENOUS | Status: DC | PRN
  Administered 2022-07-07: 130 mg via INTRAVENOUS

## 2022-07-07 MED ORDER — PHENYLEPHRINE HCL-NACL 20-0.9 MG/250ML-% IV SOLN
INTRAVENOUS | Status: DC | PRN
  Administered 2022-07-07: 25 ug/min via INTRAVENOUS

## 2022-07-07 MED ORDER — OXYCODONE HCL 5 MG PO TABS: 5.0000 mg | ORAL_TABLET | Freq: Once | ORAL | Status: DC | PRN

## 2022-07-07 MED ORDER — ONDANSETRON HCL 4 MG/2ML IJ SOLN: 4.0000 mg | Freq: Four times a day (QID) | INTRAMUSCULAR | Status: DC | PRN

## 2022-07-07 MED ORDER — DEXAMETHASONE SODIUM PHOSPHATE 10 MG/ML IJ SOLN
INTRAMUSCULAR | Status: AC
  Filled 2022-07-07: qty 1

## 2022-07-07 MED ORDER — FENTANYL CITRATE (PF) 250 MCG/5ML IJ SOLN
INTRAMUSCULAR | Status: AC
  Filled 2022-07-07: qty 5

## 2022-07-07 MED ORDER — ROCURONIUM BROMIDE 10 MG/ML (PF) SYRINGE
PREFILLED_SYRINGE | INTRAVENOUS | Status: DC | PRN
  Administered 2022-07-07: 50 mg via INTRAVENOUS

## 2022-07-07 MED ORDER — ROPIVACAINE HCL 5 MG/ML IJ SOLN
INTRAMUSCULAR | Status: DC | PRN
  Administered 2022-07-07: 15 mL via PERINEURAL

## 2022-07-07 MED ORDER — FENTANYL CITRATE (PF) 100 MCG/2ML IJ SOLN: 25.0000 ug | INTRAMUSCULAR | Status: DC | PRN

## 2022-07-07 MED ORDER — CEFAZOLIN SODIUM-DEXTROSE 2-4 GM/100ML-% IV SOLN
2.0000 g | INTRAVENOUS | Status: AC
  Administered 2022-07-07: 2 g via INTRAVENOUS
  Filled 2022-07-07: qty 100

## 2022-07-07 MED ORDER — VANCOMYCIN HCL 1000 MG IV SOLR
INTRAVENOUS | Status: AC
Start: 2022-07-07 — End: ?
  Filled 2022-07-07: qty 20

## 2022-07-07 MED ORDER — ONDANSETRON HCL 4 MG PO TABS: 4.0000 mg | ORAL_TABLET | Freq: Four times a day (QID) | ORAL | Status: DC | PRN

## 2022-07-07 MED ORDER — METOCLOPRAMIDE HCL 5 MG PO TABS: 5.0000 mg | ORAL_TABLET | Freq: Three times a day (TID) | ORAL | Status: DC | PRN

## 2022-07-07 MED ORDER — PHENYLEPHRINE 80 MCG/ML (10ML) SYRINGE FOR IV PUSH (FOR BLOOD PRESSURE SUPPORT)
PREFILLED_SYRINGE | INTRAVENOUS | Status: DC | PRN
  Administered 2022-07-07 (×4): 80 ug via INTRAVENOUS

## 2022-07-07 MED ORDER — ACETAMINOPHEN 325 MG PO TABS: 325.0000 mg | ORAL_TABLET | Freq: Four times a day (QID) | ORAL | Status: DC | PRN

## 2022-07-07 MED ORDER — METOCLOPRAMIDE HCL 5 MG/ML IJ SOLN: 5.0000 mg | Freq: Three times a day (TID) | INTRAMUSCULAR | Status: DC | PRN

## 2022-07-07 MED ORDER — 0.9 % SODIUM CHLORIDE (POUR BTL) OPTIME
TOPICAL | Status: DC | PRN
  Administered 2022-07-07: 4000 mL

## 2022-07-07 MED ORDER — FENTANYL CITRATE (PF) 250 MCG/5ML IJ SOLN
INTRAMUSCULAR | Status: DC | PRN
Start: 2022-07-07 — End: 2022-07-07
  Administered 2022-07-07 (×2): 50 ug via INTRAVENOUS

## 2022-07-07 MED ORDER — LIDOCAINE 2% (20 MG/ML) 5 ML SYRINGE
INTRAMUSCULAR | Status: AC
  Filled 2022-07-07: qty 5

## 2022-07-07 MED ORDER — DOCUSATE SODIUM 100 MG PO CAPS
100.0000 mg | ORAL_CAPSULE | Freq: Two times a day (BID) | ORAL | Status: DC
  Administered 2022-07-07 – 2022-07-08 (×2): 100 mg via ORAL
  Filled 2022-07-07 (×2): qty 1

## 2022-07-07 MED ORDER — PHENOL 1.4 % MT LIQD: 1.0000 | OROMUCOSAL | Status: DC | PRN

## 2022-07-07 MED ORDER — ACETAMINOPHEN 500 MG PO TABS
1000.0000 mg | ORAL_TABLET | Freq: Once | ORAL | Status: AC
  Administered 2022-07-07: 1000 mg via ORAL
  Filled 2022-07-07: qty 2

## 2022-07-07 MED ORDER — PHENYLEPHRINE 80 MCG/ML (10ML) SYRINGE FOR IV PUSH (FOR BLOOD PRESSURE SUPPORT)
PREFILLED_SYRINGE | INTRAVENOUS | Status: AC
  Filled 2022-07-07: qty 10

## 2022-07-07 MED ORDER — ASPIRIN 81 MG PO TBEC
81.0000 mg | DELAYED_RELEASE_TABLET | Freq: Every day | ORAL | Status: DC
  Administered 2022-07-07 – 2022-07-08 (×2): 81 mg via ORAL
  Filled 2022-07-07 (×2): qty 1

## 2022-07-07 MED ORDER — AMISULPRIDE (ANTIEMETIC) 5 MG/2ML IV SOLN: 10.0000 mg | Freq: Once | INTRAVENOUS | Status: DC | PRN

## 2022-07-07 MED ORDER — SUGAMMADEX SODIUM 200 MG/2ML IV SOLN
INTRAVENOUS | Status: DC | PRN
  Administered 2022-07-07: 100 mg via INTRAVENOUS

## 2022-07-07 MED ORDER — MENTHOL 3 MG MT LOZG: 1.0000 | LOZENGE | OROMUCOSAL | Status: DC | PRN

## 2022-07-07 MED ORDER — MIDAZOLAM HCL 2 MG/2ML IJ SOLN: 1.0000 mg | Freq: Once | INTRAMUSCULAR | Status: AC

## 2022-07-07 MED ORDER — VANCOMYCIN HCL 1000 MG IV SOLR
INTRAVENOUS | Status: DC | PRN
  Administered 2022-07-07: 1000 mg via TOPICAL

## 2022-07-07 MED ORDER — POVIDONE-IODINE 10 % EX SWAB
2.0000 | Freq: Once | CUTANEOUS | Status: AC
  Administered 2022-07-07: 2 via TOPICAL

## 2022-07-07 MED ORDER — FENTANYL CITRATE (PF) 100 MCG/2ML IJ SOLN
INTRAMUSCULAR | Status: AC
  Administered 2022-07-07: 50 ug via INTRAVENOUS
  Filled 2022-07-07: qty 2

## 2022-07-07 MED ORDER — OXYCODONE HCL 5 MG PO TABS
5.0000 mg | ORAL_TABLET | ORAL | Status: DC | PRN
  Administered 2022-07-07 – 2022-07-08 (×3): 5 mg via ORAL
  Filled 2022-07-07 (×3): qty 1

## 2022-07-07 MED ORDER — MIDAZOLAM HCL 2 MG/2ML IJ SOLN
INTRAMUSCULAR | Status: AC
  Administered 2022-07-07: 1 mg via INTRAVENOUS
  Filled 2022-07-07: qty 2

## 2022-07-07 MED ORDER — SODIUM CHLORIDE 0.9 % IV SOLN: INTRAVENOUS | Status: AC

## 2022-07-07 MED ORDER — DEXAMETHASONE SODIUM PHOSPHATE 10 MG/ML IJ SOLN
INTRAMUSCULAR | Status: AC
Start: 2022-07-07 — End: ?
  Filled 2022-07-07: qty 1

## 2022-07-07 MED ORDER — ONDANSETRON HCL 4 MG/2ML IJ SOLN: 4.0000 mg | Freq: Once | INTRAMUSCULAR | Status: DC | PRN

## 2022-07-07 MED ORDER — ONDANSETRON HCL 4 MG/2ML IJ SOLN
INTRAMUSCULAR | Status: DC | PRN
  Administered 2022-07-07: 4 mg via INTRAVENOUS

## 2022-07-07 MED ORDER — BUPIVACAINE LIPOSOME 1.3 % IJ SUSP
INTRAMUSCULAR | Status: DC | PRN
  Administered 2022-07-07: 10 mL via PERINEURAL

## 2022-07-07 MED ORDER — LACTATED RINGERS IV SOLN: INTRAVENOUS | Status: DC

## 2022-07-07 MED ORDER — OXYCODONE HCL 5 MG/5ML PO SOLN: 5.0000 mg | Freq: Once | ORAL | Status: DC | PRN

## 2022-07-07 MED ORDER — ONDANSETRON HCL 4 MG/2ML IJ SOLN
INTRAMUSCULAR | Status: AC
  Filled 2022-07-07: qty 2

## 2022-07-07 MED ORDER — IRRISEPT - 450ML BOTTLE WITH 0.05% CHG IN STERILE WATER, USP 99.95% OPTIME
TOPICAL | Status: DC | PRN
  Administered 2022-07-07: 450 mL via TOPICAL

## 2022-07-07 MED ORDER — ACETAMINOPHEN 500 MG PO TABS
1000.0000 mg | ORAL_TABLET | Freq: Four times a day (QID) | ORAL | Status: AC
  Administered 2022-07-07 – 2022-07-08 (×4): 1000 mg via ORAL
  Filled 2022-07-07 (×4): qty 2

## 2022-07-07 MED ORDER — CEFAZOLIN SODIUM-DEXTROSE 2-4 GM/100ML-% IV SOLN
2.0000 g | Freq: Three times a day (TID) | INTRAVENOUS | Status: DC
  Administered 2022-07-07 – 2022-07-08 (×2): 2 g via INTRAVENOUS
  Filled 2022-07-07 (×2): qty 100

## 2022-07-07 MED ORDER — FENTANYL CITRATE (PF) 100 MCG/2ML IJ SOLN: 50.0000 ug | Freq: Once | INTRAMUSCULAR | Status: AC

## 2022-07-07 MED ORDER — ROCURONIUM BROMIDE 10 MG/ML (PF) SYRINGE
PREFILLED_SYRINGE | INTRAVENOUS | Status: AC
Start: 2022-07-07 — End: ?
  Filled 2022-07-07: qty 10

## 2022-07-07 MED ORDER — METHOCARBAMOL 1000 MG/10ML IJ SOLN: 500.0000 mg | Freq: Four times a day (QID) | INTRAVENOUS | Status: DC | PRN

## 2022-07-07 MED ORDER — PROPOFOL 10 MG/ML IV BOLUS
INTRAVENOUS | Status: AC
  Filled 2022-07-07: qty 20

## 2022-07-07 SURGICAL SUPPLY — 80 items
ALCOHOL 70% 16 OZ (MISCELLANEOUS) ×1 IMPLANT
AUG COMP REV MI TAPER ADAPTER (Joint) ×1 IMPLANT
AUGMENT COMP REV MI TAPR ADPTR (Joint) IMPLANT
BAG COUNTER SPONGE SURGICOUNT (BAG) ×1 IMPLANT
BEARING HUMERAL SHLDER 36M STD (Shoulder) IMPLANT
BIT DRILL 2.7 W/STOP DISP (BIT) IMPLANT
BIT DRILL TWIST 2.7 (BIT) IMPLANT
BLADE SAW SGTL 13X75X1.27 (BLADE) ×1 IMPLANT
CHLORAPREP W/TINT 26 (MISCELLANEOUS) ×1 IMPLANT
CLOSURE STERI STRIP 1/2 X4 (GAUZE/BANDAGES/DRESSINGS) IMPLANT
COOLER ICEMAN CLASSIC (MISCELLANEOUS) ×1 IMPLANT
COVER SURGICAL LIGHT HANDLE (MISCELLANEOUS) ×1 IMPLANT
DRAPE INCISE IOBAN 66X45 STRL (DRAPES) ×1 IMPLANT
DRAPE U-SHAPE 47X51 STRL (DRAPES) ×2 IMPLANT
DRSG AQUACEL AG ADV 3.5X10 (GAUZE/BANDAGES/DRESSINGS) ×1 IMPLANT
ELECT BLADE 4.0 EZ CLEAN MEGAD (MISCELLANEOUS) ×1
ELECT REM PT RETURN 9FT ADLT (ELECTROSURGICAL) ×1
ELECTRODE BLDE 4.0 EZ CLN MEGD (MISCELLANEOUS) ×1 IMPLANT
ELECTRODE REM PT RTRN 9FT ADLT (ELECTROSURGICAL) ×1 IMPLANT
GAUZE SPONGE 4X4 12PLY STRL LF (GAUZE/BANDAGES/DRESSINGS) ×1 IMPLANT
GLENOID SPHERE 36+6 (Joint) IMPLANT
GLOVE BIOGEL PI IND STRL 7.0 (GLOVE) ×1 IMPLANT
GLOVE BIOGEL PI IND STRL 8 (GLOVE) ×1 IMPLANT
GLOVE BIOGEL PI INDICATOR 7.0 (GLOVE) ×2
GLOVE BIOGEL PI INDICATOR 8 (GLOVE) ×2
GLOVE ECLIPSE 7.0 STRL STRAW (GLOVE) ×1 IMPLANT
GLOVE ECLIPSE 8.0 STRL XLNG CF (GLOVE) ×1 IMPLANT
GOWN STRL REUS W/ TWL LRG LVL3 (GOWN DISPOSABLE) ×1 IMPLANT
GOWN STRL REUS W/ TWL XL LVL3 (GOWN DISPOSABLE) ×1 IMPLANT
GOWN STRL REUS W/TWL LRG LVL3 (GOWN DISPOSABLE) ×2
GOWN STRL REUS W/TWL XL LVL3 (GOWN DISPOSABLE) ×1
GUIDE MODEL REV SHLD RT (ORTHOPEDIC DISPOSABLE SUPPLIES) IMPLANT
HDLS TROCR DRIL PIN KNEE 75 (PIN) ×1
HYDROGEN PEROXIDE 16OZ (MISCELLANEOUS) ×1 IMPLANT
JET LAVAGE IRRISEPT WOUND (IRRIGATION / IRRIGATOR) ×1
KIT BASIN OR (CUSTOM PROCEDURE TRAY) ×1 IMPLANT
KIT TURNOVER KIT B (KITS) ×1 IMPLANT
LAVAGE JET IRRISEPT WOUND (IRRIGATION / IRRIGATOR) ×1 IMPLANT
LOOP VESSEL MAXI BLUE (MISCELLANEOUS) ×1 IMPLANT
MANIFOLD NEPTUNE II (INSTRUMENTS) ×1 IMPLANT
NDL SUT 6 .5 CRC .975X.05 MAYO (NEEDLE) IMPLANT
NDL TAPERED W/ NITINOL LOOP (MISCELLANEOUS) ×1 IMPLANT
NEEDLE MAYO TAPER (NEEDLE) ×1
NEEDLE TAPERED W/ NITINOL LOOP (MISCELLANEOUS) ×1 IMPLANT
NS IRRIG 1000ML POUR BTL (IV SOLUTION) ×1 IMPLANT
PACK SHOULDER (CUSTOM PROCEDURE TRAY) ×1 IMPLANT
PAD ARMBOARD 7.5X6 YLW CONV (MISCELLANEOUS) ×2 IMPLANT
PAD COLD SHLDR WRAP-ON (PAD) ×1 IMPLANT
PASSER SUT SWANSON 36MM LOOP (INSTRUMENTS) ×1 IMPLANT
PIN DRILL HDLS TROCAR 75 4PK (PIN) IMPLANT
PIN THREADED REVERSE (PIN) IMPLANT
REAMER GUIDE BUSHING SURG DISP (MISCELLANEOUS) IMPLANT
REAMER GUIDE W/SCREW AUG (MISCELLANEOUS) IMPLANT
RESTRAINT HEAD UNIVERSAL NS (MISCELLANEOUS) ×1 IMPLANT
SCREW BONE LOCKING 4.75X30X3.5 (Screw) IMPLANT
SCREW BONE STRL 6.5MMX30MM (Screw) IMPLANT
SCREW LOCKING 4.75MMX15MM (Screw) IMPLANT
SCREW LOCKING NS 4.75MMX20MM (Screw) IMPLANT
SHOULDER HUMERAL BEAR 36M STD (Shoulder) ×1 IMPLANT
SLING ARM IMMOBILIZER LRG (SOFTGOODS) ×1 IMPLANT
SOL PREP POV-IOD 4OZ 10% (MISCELLANEOUS) ×1 IMPLANT
SPONGE T-LAP 18X18 ~~LOC~~+RFID (SPONGE) ×1 IMPLANT
STEM SHOULDER (Stem) IMPLANT
STRIP CLOSURE SKIN 1/2X4 (GAUZE/BANDAGES/DRESSINGS) ×1 IMPLANT
SUCTION FRAZIER HANDLE 10FR (MISCELLANEOUS) ×1
SUCTION TUBE FRAZIER 10FR DISP (MISCELLANEOUS) ×1 IMPLANT
SUT BROADBAND TAPE 2PK 1.5 (SUTURE) IMPLANT
SUT MNCRL AB 3-0 PS2 18 (SUTURE) ×1 IMPLANT
SUT SILK 2 0 TIES 10X30 (SUTURE) ×1 IMPLANT
SUT VIC AB 0 CT1 27 (SUTURE) ×4
SUT VIC AB 0 CT1 27XBRD ANBCTR (SUTURE) ×4 IMPLANT
SUT VIC AB 1 CT1 27 (SUTURE) ×1
SUT VIC AB 1 CT1 27XBRD ANBCTR (SUTURE) ×2 IMPLANT
SUT VIC AB 1 CT1 36 (SUTURE) IMPLANT
SUT VIC AB 2-0 CT1 27 (SUTURE) ×3
SUT VIC AB 2-0 CT1 TAPERPNT 27 (SUTURE) ×3 IMPLANT
SUT VICRYL 0 UR6 27IN ABS (SUTURE) ×2 IMPLANT
TOWEL GREEN STERILE (TOWEL DISPOSABLE) ×1 IMPLANT
TRAY HUM REV SHOULDER STD +6 (Shoulder) IMPLANT
WATER STERILE IRR 1000ML POUR (IV SOLUTION) ×1 IMPLANT

## 2022-07-07 NOTE — Op Note (Signed)
Elizabeth, [name] MEDICAL RECORD NO: [id number] ACCOUNT NO: [id number] DATE OF BIRTH: September 06, 1951 FACILITY: [facility] LOCATION: [facility] PHYSICIAN: Graylin Shiver. August Saucer, MD  Operative Report   DATE OF PROCEDURE: 07/07/2022   PREOPERATIVE DIAGNOSIS:  Right shoulder arthritis.  POSTOPERATIVE DIAGNOSIS:  Right shoulder arthritis.  PROCEDURE:  Right shoulder reverse shoulder replacement using Biomet comprehensive shoulder system, size 10 mini humeral stem, mini humeral tray +6 taper offset, 40 mm diameter with standard 36 mm bearing.  Small augmented baseplate with 36+6  glenosphere, 1 central compression screw, 4 peripheral locking screw.  SURGEON:  Graylin Shiver. August Saucer, MD  ASSISTANT:  Karenann Cai, PA.  INDICATIONS:  This is a 71 year old patient with end-stage and severe right shoulder arthritis refractory to nonoperative management, who presents for operative management after explanation of risks and benefits.  DESCRIPTION OF PROCEDURE:  The patient was brought to the operating room where general anesthetic was induced.  Preoperative antibiotics administered.  Timeout was called.  The patient was placed in the beach chair position with head in neutral position.   Right shoulder, arm and hand prescrubbed with hydrogen peroxide followed by alcohol and then Betadine, which was allowed to air dry, then prepped with ChloraPrep solution and draped in sterile manner.  Ioban used to seal the operative field and cover  the operative field.  After calling timeout, a deltopectoral approach was made.  Skin and subcutaneous tissue sharply divided.  Cephalic vein mobilized laterally.  A subdeltoid space and subacromial space was manually developed.  The anterior portion of  the deltoid was released off its anterior attachment to diminish tension on the deltoid.  Next, the axillary nerve was visualized and palpated and protected at all times during the case.  Kolbel retractor was then placed.  The pec tendon was  released  about 1-3/4 cm from its attachment site.  Biceps tendon was then tenodesed to the pec tendon into surrounding soft tissue using 5-0 Vicryl sutures.  The tendon above that tenodesed area was then exposed and the rotator interval was opened up to the base  of the coracoid.  At this time, circumflex vessels were ligated.  Next the subscap was detached along with the capsule from the greater tuberosity and around and under the humeral neck around to the 5 o'clock position.  In addition, about 2 cm of the  inferior humeral neck was exposed in order to mobilize the humerus.  Next, osteophytes were removed.  The shoulder was then dislocated and a Browne retractor was placed.  Proximal reaming was performed up to a size 10. Head was cut in line with his  native version, which was about 30 degrees.  The broaching was then performed up to a size 10 and a cap was placed.  Posterior retractor was placed.  Anterior retractor was placed.  The patient did have a large loose body anterior to the subscapularis.   Using very careful dissection that large loose body measuring about 2 x 134 cm was removed.  Subscap was released circumferentially along with it.  The biceps tendon and the surrounding labrum was then removed with care being taken to avoid injury to the  axillary nerve.  Bankart lesion created from the 12 o'clock to 6 o'clock position.  Using patient-specific guides and instrumentation, the guide was placed on the glenoid and the hole in the guide pins were placed.  Reaming was performed in accordance  with preoperative templating. This was performed first with the reamer and then augmented reaming was then performed.  Good contact was achieved using the trial.  Next, the thorough irrigation was performed.  It should be noted IrriSept solution was used  after the incision as well as after the arthrotomy.  Used at all times during the case. The baseplate was then placed with 1 central compression screw,  which got very good purchase.  Next, the 4 peripheral locking screws were placed.  Trial reduction  was then performed with multiple glenospheres and multiple humeral baseplate.  The optimal stability and tension construct was +6 glenosphere with a +6 offset humeral tray with a standard poly.  Trial components were removed.  The size 10 broach was also  removed.  Canal was irrigated using IrriSept solution, 4 suture tapes were placed.  They were placed through the greater tuberosity.  Next, vancomycin powder placed into the canal.  The glenosphere was put onto the baseplate and tapped in with a good  Morse taper fit.  The stem was then tapped into position with good fit obtained.  Trial reduction was again performed and then the true humeral components were placed, which was the +6 taper mini humeral tray 40 mm humeral tray along with 36 standard  bearing.  This gave excellent stability with forward flexion, extension, and abduction as well as internal and external rotation at 90 degrees of abduction.  A thorough irrigation was performed.  IrriSept solution utilized. Axillary nerve again palpated  and was intact.  Next the subscap was repaired to the greater tuberosity using the suture tapes with the arm in 30 degrees of external rotation.  Next, vancomycin powder placed on the prosthesis.  The deltopectoral interval was then closed using #1  Vicryl suture in watertight fashion.  The skin was closed using interrupted inverted 0 Vicryl suture, 2-0 Vicryl suture, and 3-0 Monocryl.  Steri-Strips, Aquacel dressing applied.  The patient tolerated the procedure well without immediate complication.   Luke's assistance was required at all times for retraction, opening, closing, limb positioning, drilling.  His assistance was requirement at all times during the case.     SUJ D: 07/07/2022 4:14:06 pm T: 07/07/2022 10:00:00 pm  JOB: [id number]/ [id number]

## 2022-07-07 NOTE — H&P (Addendum)
Elizabeth [name] is an 71 y.o. female.   Chief Complaint: Right shoulder pain HPI: Patient is a 71 year old female with right shoulder pain.  Has longstanding history of right shoulder pain.  Work-up demonstrates end-stage glenohumeral arthritis along with rotator cuff arthropathy.  Preop CT scanning demonstrates adequate bone stock for glenoid placement.  She describes rest pain night pain as well as severe limitation with functional activities.  She plans to stay with her sister for 2 weeks after surgery.  No personal or family history of DVT or pulmonary embolism.  Past Medical History:  Diagnosis Date   Arthritis    Right Shoulder   Asthma    as a child   Dysrhythmia    NSR with sinus arrhythmia 07/05/22   Hypertension    Hx. Not current per pt    Past Surgical History:  Procedure Laterality Date   COLONOSCOPY  2020    History reviewed. No pertinent family history. Social History:  reports that she has never smoked. She has never used smokeless tobacco. She reports that she does not drink alcohol and does not use drugs.  Allergies:  Allergies  Allergen Reactions   Other Itching    Something mixed in with antibiotic, can't recall which one but it was taken in pill form.    Medications Prior to Admission  Medication Sig Dispense Refill   Multiple Vitamin (MULTIVITAMIN WITH MINERALS) TABS tablet Take 1 tablet by mouth daily.     lisinopril-hydrochlorothiazide (ZESTORETIC) 20-12.5 MG tablet Take 0.5 tablets by mouth daily as needed (blood pressure of 140/90 or higher).     loratadine (CLARITIN) 10 MG tablet Take 10 mg by mouth daily as needed for allergies.      Results for orders placed or performed during the hospital encounter of 07/05/22 (from the past 48 hour(s))  Surgical pcr screen     Status: None   Collection Time: 07/05/22  2:44 PM   Specimen: Nasal Mucosa; Nasal Swab  Result Value Ref Range   MRSA, PCR NEGATIVE NEGATIVE   Staphylococcus aureus NEGATIVE NEGATIVE     Comment: (NOTE) The Xpert SA Assay (FDA approved for NASAL specimens in patients 80 years of age and older), is one component of a comprehensive surveillance program. It is not intended to diagnose infection nor to guide or monitor treatment. Performed at [facility], [address], [place], [place] [address]   Basic metabolic panel per protocol     Status: None   Collection Time: 07/05/22  3:00 PM  Result Value Ref Range   Sodium 139 135 - 145 mmol/L   Potassium 4.1 3.5 - 5.1 mmol/L   Chloride 107 98 - 111 mmol/L   CO2 26 22 - 32 mmol/L   Glucose, Bld 88 70 - 99 mg/dL    Comment: Glucose reference range applies only to samples taken after fasting for at least 8 hours.   BUN 15 8 - 23 mg/dL   Creatinine, Ser 9.48 0.44 - 1.00 mg/dL   Calcium 9.0 8.9 - 54.6 mg/dL   GFR, Estimated >27 >03 mL/min    Comment: (NOTE) Calculated using the CKD-EPI Creatinine Equation (2021)    Anion gap 6 5 - 15    Comment: Performed at [facility], [address], [place], [place] [address]  CBC per protocol     Status: None   Collection Time: 07/05/22  3:00 PM  Result Value Ref Range   WBC 8.9 4.0 - 10.5 K/uL  RBC 3.94 3.87 - 5.11 MIL/uL   Hemoglobin 12.6 12.0 - 15.0 g/dL   HCT 28.3 66.2 - 94.7 %   MCV 96.7 80.0 - 100.0 fL   MCH 32.0 26.0 - 34.0 pg   MCHC 33.1 30.0 - 36.0 g/dL   RDW 65.4 65.0 - 35.4 %   Platelets 350 150 - 400 K/uL   nRBC 0.0 0.0 - 0.2 %    Comment: Performed at [facility], [address], [place], [place] [address]   No results found.  Review of Systems  Musculoskeletal:  Positive for arthralgias.  All other systems reviewed and are negative.   Blood pressure (!) 170/91, pulse 72, temperature 98.3 F (36.8 C), temperature source Oral, height 5\' 7"  (1.702 m), weight 90.7 kg, SpO2 96 %. Physical Exam Vitals reviewed.  HENT:     Head: Normocephalic.     Nose: Nose normal.     Mouth/Throat:     Mouth: Mucous membranes are moist.  Eyes:      Pupils: Pupils are equal, round, and reactive to light.  Cardiovascular:     Rate and Rhythm: Normal rate.     Pulses: Normal pulses.  Pulmonary:     Effort: Pulmonary effort is normal.  Abdominal:     General: Abdomen is flat.  Musculoskeletal:     Cervical back: Normal range of motion.  Skin:    General: Skin is warm.     Capillary Refill: Capillary refill takes less than 2 seconds.  Neurological:     General: No focal deficit present.     Mental Status: She is alert.  Psychiatric:        Mood and Affect: Mood normal.    Ortho exam demonstrates full active and passive range of motion of the cervical spine.  5 out of 5 grip EPL FPL interosseous wrist flexion extension bicep triceps and deltoid strength.  Passive range of motion on the right is 15/40/80.  Passive range of motion on the left is 25/50/90.  Rotator cuff strength is difficult to assess on the right but feels reasonably intact.  No masses lymphadenopathy or skin changes noted in that shoulder girdle region.  Radial pulse is intact  Assessment/Plan Impression is end-stage right shoulder arthritis with rotator cuff arthropathy and limited motion.  Plan is reverse shoulder replacement.  The risk and benefits are discussed with the patient including not limited to infection nerve vessel damage instability as well as potential incomplete restoration of functional activity as well as incomplete pain relief.  Patient understands the risk and benefits and wishes to proceed.  All questions answered.  CPM use for home for the first 2 weeks after surgery has been arranged.  BMI: Estimated body mass index is 31.31 kg/m as calculated from the following:   Height as of this encounter: 5\' 7"  (1.702 m).   Weight as of this encounter: 90.7 kg.  No results found for: "ALBUMIN" Diabetes: Patient does not have a diagnosis of diabetes.     Smoking Status:   reports that she has never smoked. She has never used smokeless tobacco.    05-08-1996, MD 07/07/2022, 11:29 AM

## 2022-07-07 NOTE — Brief Op Note (Signed)
   07/07/2022  4:06 PM  PATIENT:  Elizabeth [name]  71 y.o. female  PRE-OPERATIVE DIAGNOSIS:  right shoulder osteoarthritis  POST-OPERATIVE DIAGNOSIS:  right shoulder osteoarthritis  PROCEDURE:  Procedure(s): RIGHT REVERSE SHOULDER ARTHROPLASTY  SURGEON:  Surgeon(s): August Saucer, Corrie Mckusick, MD  ASSISTANT: Karenann Cai, PA  ANESTHESIA:   General  EBL: 150 ml    Total I/O In: 1200 [I.V.:1000; IV Piggyback:200] Out: 150 [Blood:150]  BLOOD ADMINISTERED: none  DRAINS: None  LOCAL MEDICATIONS USED: Vancomycin  SPECIMEN:  No Specimen  COUNTS:  YES  TOURNIQUET:  * No tourniquets in log *  DICTATION: .Other Dictation: Dictation Number [id number]  PLAN OF CARE: Admit for overnight observation  PATIENT DISPOSITION:  PACU - hemodynamically stable

## 2022-07-07 NOTE — Anesthesia Procedure Notes (Signed)
Anesthesia Regional Block: Interscalene brachial plexus block   Pre-Anesthetic Checklist: , timeout performed,  Correct Patient, Correct Site, Correct Laterality,  Correct Procedure, Correct Position, site marked,  Risks and benefits discussed,  Surgical consent,  Pre-op evaluation,  At surgeon's request and post-op pain management  Laterality: Right  Prep: Maximum Sterile Barrier Precautions used, chloraprep       Needles:  Injection technique: Single-shot  Needle Type: Echogenic Stimulator Needle     Needle Length: 9cm  Needle Gauge: 22     Additional Needles:   Procedures:,,,, ultrasound used (permanent image in chart),,    Narrative:  Start time: 07/07/2022 11:30 AM End time: 07/07/2022 11:35 AM Injection made incrementally with aspirations every 5 mL.  Performed by: Personally  Anesthesiologist: Lannie Fields, DO  Additional Notes: Monitors applied. No increased pain on injection. No increased resistance to injection. Injection made in 5cc increments. Good needle visualization. Patient tolerated procedure well.

## 2022-07-07 NOTE — Anesthesia Postprocedure Evaluation (Signed)
Anesthesia Post Note  Patient: Chief of Staff  Procedure(s) Performed: RIGHT REVERSE SHOULDER ARTHROPLASTY (Right: Shoulder)     Patient location during evaluation: PACU Anesthesia Type: Regional and General Level of consciousness: awake and alert, oriented and patient cooperative Pain management: pain level controlled Vital Signs Assessment: post-procedure vital signs reviewed and stable Respiratory status: spontaneous breathing, nonlabored ventilation and respiratory function stable Cardiovascular status: blood pressure returned to baseline and stable Postop Assessment: no apparent nausea or vomiting Anesthetic complications: no   No notable events documented.  Last Vitals:  Vitals:   07/07/22 1600 07/07/22 1615  BP: (!) 143/66 139/69  Pulse: 71 70  Resp: 15 15  Temp:    SpO2: 97% 96%    Last Pain:  Vitals:   07/07/22 1615  TempSrc:   PainSc: 0-No pain                 Lannie Fields

## 2022-07-07 NOTE — Anesthesia Procedure Notes (Signed)
Procedure Name: Intubation Date/Time: 07/07/2022 12:24 PM  Performed by: Nils Pyle, CRNAPre-anesthesia Checklist: Patient identified, Emergency Drugs available, Suction available and Patient being monitored Patient Re-evaluated:Patient Re-evaluated prior to induction Oxygen Delivery Method: Circle System Utilized Preoxygenation: Pre-oxygenation with 100% oxygen Induction Type: IV induction Ventilation: Mask ventilation without difficulty Laryngoscope Size: Miller and 2 Grade View: Grade I Tube type: Oral Tube size: 7.0 mm Number of attempts: 1 Airway Equipment and Method: Stylet and Oral airway Placement Confirmation: ETT inserted through vocal cords under direct vision, positive ETCO2 and breath sounds checked- equal and bilateral Secured at: 22 cm Tube secured with: Tape Dental Injury: Teeth and Oropharynx as per pre-operative assessment

## 2022-07-07 NOTE — Transfer of Care (Signed)
Immediate Anesthesia Transfer of Care Note  Patient: Chief of Staff  Procedure(s) Performed: RIGHT REVERSE SHOULDER ARTHROPLASTY (Right: Shoulder)  Patient Location: PACU  Anesthesia Type:General  Level of Consciousness: drowsy  Airway & Oxygen Therapy: Patient Spontanous Breathing and Patient connected to nasal cannula oxygen  Post-op Assessment: Report given to RN and Post -op Vital signs reviewed and stable  Post vital signs: Reviewed and stable  Last Vitals:  Vitals Value Taken Time  BP 142/90 07/07/22 1553  Temp 36.3 C 07/07/22 1553  Pulse 76 07/07/22 1558  Resp 13 07/07/22 1558  SpO2 97 % 07/07/22 1558  Vitals shown include unvalidated device data.  Last Pain:  Vitals:   07/07/22 1028  TempSrc: Oral  PainSc: 0-No pain         Complications: No notable events documented.

## 2022-07-08 DIAGNOSIS — M19011 Primary osteoarthritis, right shoulder: Secondary | ICD-10-CM | POA: Diagnosis not present

## 2022-07-08 MED ORDER — METHOCARBAMOL 500 MG PO TABS: 500.0000 mg | ORAL_TABLET | Freq: Three times a day (TID) | ORAL | 0 refills | Status: AC | PRN

## 2022-07-08 MED ORDER — ASPIRIN 81 MG PO TBEC
81.0000 mg | DELAYED_RELEASE_TABLET | Freq: Every day | ORAL | 12 refills | Status: AC
Start: 2022-07-08 — End: ?

## 2022-07-08 MED ORDER — OXYCODONE HCL 5 MG PO TABS: 5.0000 mg | ORAL_TABLET | ORAL | 0 refills | Status: AC | PRN

## 2022-07-08 MED ORDER — ACETAMINOPHEN 500 MG PO TABS
1000.0000 mg | ORAL_TABLET | Freq: Four times a day (QID) | ORAL | 0 refills | Status: AC
Start: 2022-07-08 — End: ?

## 2022-07-08 NOTE — Evaluation (Signed)
Physical Therapy Evaluation  Patient Details Name: Elizabeth [name] MRN: [id number] DOB: Feb 12, 1951 Today's Date: 07/08/2022  History of Present Illness  Pt is a 71 y/o female who presents s/p R reverse shoulder arthroplasty on 07/07/2022. PMH significant for dysrhythmia, HTN.   Clinical Impression  This patient presents with acute pain and decreased functional independence following the above mentioned procedure. At the time of PT eval, pt was able to perform transfers and ambulation with gross min guard assist and no AD. Sling adjusted for optimal fit but may need a larger size - will defer to OT. Pt anticipates d/c home today and will have daughter's assist for the first ~1 month or so. Acutely, this patient is appropriate for skilled PT interventions to address functional limitations, improve safety and independence with functional mobility, and return to PLOF.        Recommendations for follow up therapy are one component of a multi-disciplinary discharge planning process, led by the attending physician.  Recommendations may be updated based on patient status, additional functional criteria and insurance authorization.  Follow Up Recommendations Follow physician's recommendations for discharge plan and follow up therapies      Assistance Recommended at Discharge Set up Supervision/Assistance  Patient can return home with the following  A little help with walking and/or transfers;A little help with bathing/dressing/bathroom;Assistance with cooking/housework;Assist for transportation;Help with stairs or ramp for entrance    Equipment Recommendations None recommended by PT  Recommendations for Other Services       Functional Status Assessment Patient has had a recent decline in their functional status and demonstrates the ability to make significant improvements in function in a reasonable and predictable amount of time.     Precautions / Restrictions Precautions Precautions:  Shoulder Type of Shoulder Precautions: Sling at all times except ADL/exercise, AROM elbow, wrist, and hand to tolerance, pendulums, ER 0-30, no AROM of shoulder. Shoulder Interventions: Shoulder sling/immobilizer;Off for dressing/bathing/exercises Precaution Comments: Reviewed general precautions. Required Braces or Orthoses: Sling Restrictions Weight Bearing Restrictions: Yes RUE Weight Bearing: Non weight bearing      Mobility  Bed Mobility Overal bed mobility: Needs Assistance Bed Mobility: Supine to Sit     Supine to sit: Min assist     General bed mobility comments: Light assist to elevate trunk to full sitting position. Pt using momentum to flex trunk to sit up without using RUE.    Transfers Overall transfer level: Needs assistance Equipment used: None Transfers: Sit to/from Stand Sit to Stand: Min guard, Supervision           General transfer comment: Close guard for safety as pt powered up to full stand. No unsteadiness or LOB noted and progressed to supervision by end of session.    Ambulation/Gait Ambulation/Gait assistance: Min guard Gait Distance (Feet): 350 Feet Assistive device: None Gait Pattern/deviations: Step-through pattern, Decreased stride length, Drifts right/left Gait velocity: Decreased Gait velocity interpretation: 1.31 - 2.62 ft/sec, indicative of limited community ambulator   General Gait Details: VC's for general safety. Good posture throughout. Occasional drifting in hall but no overt LOB noted.  Stairs            Wheelchair Mobility    Modified Rankin (Stroke Patients Only)       Balance Overall balance assessment: Needs assistance Sitting-balance support: Feet supported, No upper extremity supported Sitting balance-Leahy Scale: Fair     Standing balance support: No upper extremity supported, During functional activity Standing balance-Leahy Scale: Fair  Pertinent  Vitals/Pain Pain Assessment Pain Assessment: No/denies pain    Home Living Family/patient expects to be discharged to:: Private residence Living Arrangements: Other relatives Available Help at Discharge: Family;Available 24 hours/day (Daughter) Type of Home: House Home Access: Level entry       Home Layout: One level Home Equipment: Cane - single point;Tub bench;Shower seat      Prior Function Prior Level of Function : Independent/Modified Independent;Driving                     Hand Dominance   Dominant Hand: Right    Extremity/Trunk Assessment   Upper Extremity Assessment Upper Extremity Assessment: RUE deficits/detail RUE Deficits / Details: UE in sling which appears tight around the upper arm and around elbow. May need another size. RUE: Unable to fully assess due to immobilization    Lower Extremity Assessment Lower Extremity Assessment: Overall WFL for tasks assessed    Cervical / Trunk Assessment Cervical / Trunk Assessment: Normal  Communication   Communication: No difficulties  Cognition Arousal/Alertness: Awake/alert Behavior During Therapy: WFL for tasks assessed/performed Overall Cognitive Status: Within Functional Limits for tasks assessed                                          General Comments      Exercises     Assessment/Plan    PT Assessment Patient needs continued PT services  PT Problem List Decreased strength;Decreased range of motion;Decreased activity tolerance;Decreased balance;Decreased mobility;Decreased knowledge of use of DME;Decreased safety awareness;Decreased knowledge of precautions       PT Treatment Interventions DME instruction;Gait training;Functional mobility training;Therapeutic activities;Therapeutic exercise;Balance training;Patient/family education    PT Goals (Current goals can be found in the Care Plan section)  Acute Rehab PT Goals Patient Stated Goal: Home today, be able to take care  of her house and animals PT Goal Formulation: With patient Time For Goal Achievement: 07/15/22 Potential to Achieve Goals: Good    Frequency Min 3X/week     Co-evaluation               AM-PAC PT "6 Clicks" Mobility  Outcome Measure Help needed turning from your back to your side while in a flat bed without using bedrails?: A Little Help needed moving from lying on your back to sitting on the side of a flat bed without using bedrails?: A Little Help needed moving to and from a bed to a chair (including a wheelchair)?: A Little Help needed standing up from a chair using your arms (e.g., wheelchair or bedside chair)?: A Little Help needed to walk in hospital room?: A Little Help needed climbing 3-5 steps with a railing? : A Little 6 Click Score: 18    End of Session Equipment Utilized During Treatment: Gait belt Activity Tolerance: Patient tolerated treatment well Patient left: in chair;with call bell/phone within reach Nurse Communication: Mobility status PT Visit Diagnosis: Unsteadiness on feet (R26.81);Difficulty in walking, not elsewhere classified (R26.2)    Time: 7124-5809 PT Time Calculation (min) (ACUTE ONLY): 16 min   Charges:   PT Evaluation $PT Eval Moderate Complexity: 1 Mod          Conni Slipper, PT, DPT Acute Rehabilitation Services Secure Chat Preferred Office: [phone]   Marylynn Pearson 07/08/2022, 10:32 AM

## 2022-07-08 NOTE — Progress Notes (Signed)
  Subjective: Elizabeth [name] is a 71 y.o. female s/p right RSA.  They are POD 1.  Pt's pain is controlled.  Patient denies any complaints of chest pain, shortness of breath, abdominal pain.  Block still has not completely worn off but she is able to move her hand now.  She has been ambulatory throughout the hall without any lightheadedness or dizziness.  Has not worked with occupational therapy yet.  Objective: Vital signs in last 24 hours: Temp:  [97.4 F (36.3 C)-98.5 F (36.9 C)] 98.5 F (36.9 C) (09/01 0801) Pulse Rate:  [58-77] 60 (09/01 0801) Resp:  [8-22] 16 (09/01 0801) BP: (135-178)/(61-116) 137/61 (09/01 0801) SpO2:  [92 %-98 %] 96 % (09/01 0801) Weight:  [90.7 kg] 90.7 kg (08/31 1028)  Intake/Output from previous day: 08/31 0701 - 09/01 0700 In: 1200 [I.V.:1000; IV Piggyback:200] Out: 150 [Blood:150] Intake/Output this shift: No intake/output data recorded.  Exam:  No gross blood or drainage overlying the dressing 2+ radial pulse of the operative extremity Postoperative physical exam somewhat limited by interscalene block but intact EPL, FPL, finger abduction, grip strength test, wrist extension   Labs: Recent Labs    07/05/22 1500 07/07/22 1837  HGB 12.6 12.8   Recent Labs    07/05/22 1500 07/07/22 1837  WBC 8.9 15.1*  RBC 3.94 3.96  HCT 38.1 38.9  PLT 350 332   Recent Labs    07/05/22 1500 07/07/22 1837  NA 139 138  K 4.1 3.9  CL 107 103  CO2 26 25  BUN 15 10  CREATININE 0.89 0.66  GLUCOSE 88 172*  CALCIUM 9.0 8.9   No results for input(s): "LABPT", "INR" in the last 72 hours.  Assessment/Plan: Pt is POD 1 s/p right RSA    -Plan to discharge to home today after patient worked with occupational therapy  -No lifting with the operative arm more than 1 to 2 pounds  -Stay in sling except for showering/sleeping and using CPM machine at home  -Follow-up with Dr. August Saucer in clinic 2 weeks postoperatively     Julieanne Cotton 07/08/2022, 8:39 AM

## 2022-07-08 NOTE — Progress Notes (Signed)
Patient alert and oriented, mae's well, voiding adequate amount of urine, swallowing without difficulty, no c/o pain at time of discharge. Patient discharged home with family. Script and discharged instructions given to patient. Patient and family stated understanding of instructions given. Patient has an appointment with Dr. August Saucer in 2 weeks

## 2022-07-08 NOTE — Evaluation (Signed)
Occupational Therapy Evaluation Patient Details Name: Elizabeth [name] MRN: [id number] DOB: 1951/09/20 Today's Date: 07/08/2022   History of Present Illness Pt is a 71 y/o female who presents s/p R reverse shoulder arthroplasty on 07/07/2022. PMH significant for dysrhythmia, HTN.   Clinical Impression   PTA, pt was mod I in ADL, IADL, and driving. Upon eval, pt requiring up to mod A for LB ADL and mod-max A for UB ADL. Pt reporting she will be staying with her daughter for several weeks following discharge and daughter or grand daughter will be available to assist as needed. Pt educated and demonstrating use of compensatory techniques for UB dressing, UB bathing, showering, grooming, bed mobility/positioning, and sling application within precautions. Pt educated and demonstrating UE exercises as recommended per physician protocol (see below). Pt benefiting from frequent cues to avoid shoulder hiking during ADL and recommended exercises. Recommend follow physicians orders for discharge.      Recommendations for follow up therapy are one component of a multi-disciplinary discharge planning process, led by the attending physician.  Recommendations may be updated based on patient status, additional functional criteria and insurance authorization.   Follow Up Recommendations  Follow physician's recommendations for discharge plan and follow up therapies    Assistance Recommended at Discharge Intermittent Supervision/Assistance  Patient can return home with the following A little help with bathing/dressing/bathroom;Assistance with cooking/housework;Assist for transportation    Functional Status Assessment  Patient has had a recent decline in their functional status and demonstrates the ability to make significant improvements in function in a reasonable and predictable amount of time.  Equipment Recommendations  None recommended by OT (Pt has all recommended equipment)    Recommendations for Other  Services       Precautions / Restrictions Precautions Precautions: Shoulder Type of Shoulder Precautions: Sling at all times except ADL/exercise, AROM elbow, wrist, and hand to tolerance, pendulums, ER 0-30, no AROM of shoulder. Shoulder Interventions: Shoulder sling/immobilizer;Off for dressing/bathing/exercises Precaution Comments: Reviewed general precautions. Required Braces or Orthoses: Sling Restrictions Weight Bearing Restrictions: Yes RUE Weight Bearing: Non weight bearing      Mobility Bed Mobility               General bed mobility comments: Pt in chair on arrival and departure    Transfers Overall transfer level: Needs assistance Equipment used: None Transfers: Sit to/from Stand Sit to Stand: Supervision           General transfer comment: Supervision for safety      Balance Overall balance assessment: Needs assistance Sitting-balance support: Feet supported, No upper extremity supported Sitting balance-Leahy Scale: Fair     Standing balance support: No upper extremity supported, During functional activity Standing balance-Leahy Scale: Fair                             ADL either performed or assessed with clinical judgement   ADL Overall ADL's : Needs assistance/impaired Eating/Feeding: Set up;Sitting   Grooming: Supervision/safety;Standing   Upper Body Bathing: Set up;Sitting;Adhering to UE precautions Upper Body Bathing Details (indicate cue type and reason): educated on use of compensatory techniques Lower Body Bathing: Set up;Sit to/from stand   Upper Body Dressing : Minimal assistance;Sitting;Cueing for compensatory techniques;Adhering to UE precautions Upper Body Dressing Details (indicate cue type and reason): educated and demonstrating use of compensatory techniques with coes to avoid shoulder hiking Lower Body Dressing: Sit to/from stand;Moderate assistance Lower Body Dressing Details (indicate cue type and reason):  Min  A to pull up pants on L, don socks, and button pants Toilet Transfer: Supervision/safety;Ambulation;Comfort height toilet   Toileting- Clothing Manipulation and Hygiene: Moderate assistance;Sit to/from stand Toileting - Clothing Manipulation Details (indicate cue type and reason): Mod A for pants management     Functional mobility during ADLs: Supervision/safety General ADL Comments: Pt educated and demonstrating use of all compensatory techniques.     Vision Baseline Vision/History: 1 Wears glasses Ability to See in Adequate Light: 0 Adequate Patient Visual Report: No change from baseline Vision Assessment?: No apparent visual deficits Additional Comments: pt following along with written handouts during session     Perception     Praxis      Pertinent Vitals/Pain Pain Assessment Pain Assessment: No/denies pain     Hand Dominance Right   Extremity/Trunk Assessment Upper Extremity Assessment Upper Extremity Assessment: RUE deficits/detail RUE Deficits / Details: Shoulder surgery. Pt reports elbow flexion feels very heavy and requiring AAROM to perfrom elbow flexion at this time. Sling readjusted and with better fit RUE: Unable to fully assess due to immobilization   Lower Extremity Assessment Lower Extremity Assessment: Overall WFL for tasks assessed   Cervical / Trunk Assessment Cervical / Trunk Assessment: Normal   Communication Communication Communication: No difficulties   Cognition Arousal/Alertness: Awake/alert Behavior During Therapy: WFL for tasks assessed/performed Overall Cognitive Status: Within Functional Limits for tasks assessed                                 General Comments: Pt observed to be right handed, and with very habitual use of RUE. Pt required min-mod cues throughout session for body mechanics and to reduce use of compensatory techniques for RUE elbow, hand, and wrist exercises and pendulums to avoid active movement at  shoulder.     General Comments  Pt requiring frequent cues to avoid shoulder hiking and active use of shoulder musculature during pendulums. RN to notify daughter to assist with decreasing compensation.    Exercises Exercises: Shoulder Shoulder Exercises Pendulum Exercise: PROM, Right, 10 reps Elbow Flexion: AROM, Right, 10 reps, Standing Wrist Flexion: AAROM, Right, 10 reps Wrist Extension: AAROM, Right, 10 reps Digit Composite Flexion: AAROM, Right, 10 reps Composite Extension: AAROM, Right, 10 reps   Shoulder Instructions Shoulder Instructions Donning/doffing shirt without moving shoulder: Minimal assistance Method for sponge bathing under operated UE: Supervision/safety Donning/doffing sling/immobilizer: Maximal assistance;Moderate assistance Correct positioning of sling/immobilizer: Supervision/safety (Pt educated regarding positioning) Pendulum exercises (written home exercise program): Supervision/safety (cues for technique) ROM for elbow, wrist and digits of operated UE: Supervision/safety;Moderate assistance (MOD AAROM for elbow flexion at this time.) Sling wearing schedule (on at all times/off for ADL's): Supervision/safety Proper positioning of operated UE when showering: Supervision/safety Dressing change:  (Pt educated that dressing would be changed at dr. appt) Positioning of UE while sleeping: Supervision/safety (pt educated and verbalizing understanding)    Home Living Family/patient expects to be discharged to:: Private residence Living Arrangements: Other relatives Available Help at Discharge: Family;Available 24 hours/day (daughter) Type of Home: House Home Access: Level entry     Home Layout: One level     Bathroom Shower/Tub: Chief Strategy Officer: Standard     Home Equipment: Cane - single point;Tub bench;Shower seat          Prior Functioning/Environment Prior Level of Function : Independent/Modified Independent;Driving  ADLs Comments: Pt reporting prior use of compenstory techniques due to decreased ROM in shoulder        OT Problem List: Decreased strength;Decreased activity tolerance;Decreased range of motion;Impaired balance (sitting and/or standing);Decreased coordination;Decreased knowledge of precautions;Impaired UE functional use      OT Treatment/Interventions:      OT Goals(Current goals can be found in the care plan section) Acute Rehab OT Goals Patient Stated Goal: For shoulder to heal OT Goal Formulation: With patient  OT Frequency:      Co-evaluation              AM-PAC OT "6 Clicks" Daily Activity     Outcome Measure Help from another person eating meals?: A Little Help from another person taking care of personal grooming?: A Little Help from another person toileting, which includes using toliet, bedpan, or urinal?: A Little Help from another person bathing (including washing, rinsing, drying)?: A Little Help from another person to put on and taking off regular upper body clothing?: A Lot Help from another person to put on and taking off regular lower body clothing?: A Lot 6 Click Score: 16   End of Session Equipment Utilized During Treatment:  (sling) Nurse Communication: Mobility status  Activity Tolerance: Patient tolerated treatment well Patient left: in chair;with call bell/phone within reach  OT Visit Diagnosis: Unsteadiness on feet (R26.81);Muscle weakness (generalized) (M62.81)                Time: 2585-2778 OT Time Calculation (min): 51 min Charges:  OT General Charges $OT Visit: 1 Visit OT Evaluation $OT Eval Low Complexity: 1 Low OT Treatments $Self Care/Home Management : 23-37 mins  Ladene Artist, OTR/L [facility] Acute Rehabilitation Office: [phone]   Drue Novel 07/08/2022, 12:20 PM

## 2022-07-08 NOTE — Care Management Obs Status (Signed)
MEDICARE OBSERVATION STATUS NOTIFICATION   Patient Details  Name: Elizabeth [name] MRN: [id number] Date of Birth: 1951-08-16   Medicare Observation Status Notification Given:  Yes    Glennon Mac, RN 07/08/2022, 9:51 AM

## 2022-07-11 NOTE — Discharge Summary (Signed)
Physician Discharge Summary      Patient ID: Elizabeth [name] MRN: [id number] DOB/AGE: Apr 24, 1951 71 y.o.  Admit date: 07/07/2022 Discharge date: 07/08/2022  Admission Diagnoses:  Principal Problem:   S/P reverse total shoulder arthroplasty, right   Discharge Diagnoses:  Same  Surgeries: Procedure(s): RIGHT REVERSE SHOULDER ARTHROPLASTY on 07/07/2022   Consultants:   Discharged Condition: Stable  Hospital Course: Elizabeth [name] is an 71 y.o. female who was admitted 07/07/2022 with a chief complaint of right shoulder pain, and found to have a diagnosis of right shoulder arthritis.  They were brought to the operating room on 07/07/2022 and underwent the above named procedures.  Pt awoke from anesthesia without complication and was transferred to the floor. On POD1, patient's pain was controlled.  Block is still in effect.  She had no red flag symptoms.  She is able to ambulate around the room and down the hall.  Discharged home on POD 1.  Pt will f/u with Dr. August Saucer in clinic in ~2 weeks.   Antibiotics given:  Anti-infectives (From admission, onward)    Start     Dose/Rate Route Frequency Ordered Stop   07/07/22 2200  ceFAZolin (ANCEF) IVPB 2g/100 mL premix  Status:  Discontinued        2 g 200 mL/hr over 30 Minutes Intravenous Every 8 hours 07/07/22 1801 07/08/22 1912   07/07/22 1453  vancomycin (VANCOCIN) powder  Status:  Discontinued          As needed 07/07/22 1453 07/07/22 1550   07/07/22 1015  ceFAZolin (ANCEF) IVPB 2g/100 mL premix        2 g 200 mL/hr over 30 Minutes Intravenous On call to O.R. 07/07/22 1013 07/07/22 1235     .  Recent vital signs:  Vitals:   07/08/22 0320 07/08/22 0801  BP: (!) 141/63 137/61  Pulse: (!) 58 60  Resp: 18 16  Temp: 98 F (36.7 C) 98.5 F (36.9 C)  SpO2: 97% 96%    Recent laboratory studies:  Results for orders placed or performed during the hospital encounter of 07/07/22  CBC  Result Value Ref Range   WBC 15.1 (H) 4.0 - 10.5 K/uL    RBC 3.96 3.87 - 5.11 MIL/uL   Hemoglobin 12.8 12.0 - 15.0 g/dL   HCT 93.8 18.2 - 99.3 %   MCV 98.2 80.0 - 100.0 fL   MCH 32.3 26.0 - 34.0 pg   MCHC 32.9 30.0 - 36.0 g/dL   RDW 71.6 96.7 - 89.3 %   Platelets 332 150 - 400 K/uL   nRBC 0.0 0.0 - 0.2 %  Basic metabolic panel  Result Value Ref Range   Sodium 138 135 - 145 mmol/L   Potassium 3.9 3.5 - 5.1 mmol/L   Chloride 103 98 - 111 mmol/L   CO2 25 22 - 32 mmol/L   Glucose, Bld 172 (H) 70 - 99 mg/dL   BUN 10 8 - 23 mg/dL   Creatinine, Ser 8.10 0.44 - 1.00 mg/dL   Calcium 8.9 8.9 - 17.5 mg/dL   GFR, Estimated >10 >25 mL/min   Anion gap 10 5 - 15    Discharge Medications:   Allergies as of 07/08/2022       Reactions   Other Itching   Something mixed in with antibiotic, can't recall which one but it was taken in pill form.        Medication List     TAKE these medications    acetaminophen 500 MG tablet  Commonly known as: TYLENOL Take 2 tablets (1,000 mg total) by mouth every 6 (six) hours.   aspirin EC 81 MG tablet Take 1 tablet (81 mg total) by mouth daily. Swallow whole.   lisinopril-hydrochlorothiazide 20-12.5 MG tablet Commonly known as: ZESTORETIC Take 0.5 tablets by mouth daily as needed (blood pressure of 140/90 or higher).   loratadine 10 MG tablet Commonly known as: CLARITIN Take 10 mg by mouth daily as needed for allergies.   methocarbamol 500 MG tablet Commonly known as: ROBAXIN Take 1 tablet (500 mg total) by mouth every 8 (eight) hours as needed for muscle spasms.   multivitamin with minerals Tabs tablet Take 1 tablet by mouth daily.   oxyCODONE 5 MG immediate release tablet Commonly known as: Oxy IR/ROXICODONE Take 1 tablet (5 mg total) by mouth every 4 (four) hours as needed for moderate pain (pain score 4-6).        Diagnostic Studies: DG Shoulder Right Port  Result Date: 07/07/2022 CLINICAL DATA:  Status post reverse total shoulder arthroplasty, right. EXAM: RIGHT SHOULDER - 1 VIEW  COMPARISON:  Right shoulder radiographs 08/18/2021 FINDINGS: Interval reverse total right shoulder arthroplasty. No perihardware lucency is seen to indicate hardware loosening. Expected postoperative changes including subacromial/subdeltoid and subcutaneous air and soft tissue swelling. Moderate degenerative changes of the right acromioclavicular joint. No acute fracture or dislocation. The visualized portion of the right lung is unremarkable. IMPRESSION: Interval reverse total right shoulder arthroplasty without evidence of hardware failure. Electronically Signed   By: Neita Garnet M.D.   On: 07/07/2022 17:05    Disposition: Discharge disposition: 01-Home or Self Care       Discharge Instructions     Call MD / Call 911   Complete by: As directed    If you experience chest pain or shortness of breath, CALL 911 and be transported to the hospital emergency room.  If you develope a fever above 101 F, pus (white drainage) or increased drainage or redness at the wound, or calf pain, call your surgeon's office.   Constipation Prevention   Complete by: As directed    Drink plenty of fluids.  Prune juice may be helpful.  You may use a stool softener, such as Colace (over the counter) 100 mg twice a day.  Use MiraLax (over the counter) for constipation as needed.   Diet - low sodium heart healthy   Complete by: As directed    Discharge instructions   Complete by: As directed    You may shower, dressing is waterproof.  Do not bathe or soak the operative shoulder in a tub, pool.  Use the CPM machine 3 times a day for one hour each time, increasing the degrees of range of motion with each session.  No lifting with the operative shoulder. Continue use of the sling when you are not showering, sleeping, using CPM machine.  Follow-up with Dr. August Saucer in ~2 weeks on your given appointment date.  We will remove your adhesive bandage at that time.  Call the office at [phone] with any questions or  concerns  Dental Antibiotics:  In most cases prophylactic antibiotics for Dental procdeures after total joint surgery are not necessary.  Exceptions are as follows:  1. History of prior total joint infection  2. Severely immunocompromised (Organ Transplant, cancer chemotherapy, Rheumatoid biologic meds such as Humera)  3. Poorly controlled diabetes (A1C &gt; 8.0, blood glucose over 200)  If you have one of these conditions, contact your surgeon for an antibiotic prescription,  prior to your dental procedure.   Increase activity slowly as tolerated   Complete by: As directed    Post-operative opioid taper instructions:   Complete by: As directed    POST-OPERATIVE OPIOID TAPER INSTRUCTIONS: It is important to wean off of your opioid medication as soon as possible. If you do not need pain medication after your surgery it is ok to stop day one. Opioids include: Codeine, Hydrocodone(Norco, Vicodin), Oxycodone(Percocet, oxycontin) and hydromorphone amongst others.  Long term and even short term use of opiods can cause: Increased pain response Dependence Constipation Depression Respiratory depression And more.  Withdrawal symptoms can include Flu like symptoms Nausea, vomiting And more Techniques to manage these symptoms Hydrate well Eat regular healthy meals Stay active Use relaxation techniques(deep breathing, meditating, yoga) Do Not substitute Alcohol to help with tapering If you have been on opioids for less than two weeks and do not have pain than it is ok to stop all together.  Plan to wean off of opioids This plan should start within one week post op of your joint replacement. Maintain the same interval or time between taking each dose and first decrease the dose.  Cut the total daily intake of opioids by one tablet each day Next start to increase the time between doses. The last dose that should be eliminated is the evening dose.           Follow-up Information      August Saucer, Corrie Mckusick, MD. Call.   Specialty: Orthopedic Surgery Why: As needed, If symptoms worsen Contact information: [address] [place] [address] [phone]                  Signed: Julieanne Cotton 07/11/2022, 4:15 PM

## 2022-07-12 ENCOUNTER — Encounter (HOSPITAL_COMMUNITY): Payer: Self-pay | Admitting: Orthopedic Surgery

## 2022-07-21 ENCOUNTER — Encounter: Payer: Medicare Other | Admitting: Orthopedic Surgery

## 2022-07-22 ENCOUNTER — Ambulatory Visit (INDEPENDENT_AMBULATORY_CARE_PROVIDER_SITE_OTHER): Payer: Medicare Other | Admitting: Surgical

## 2022-07-22 ENCOUNTER — Ambulatory Visit (INDEPENDENT_AMBULATORY_CARE_PROVIDER_SITE_OTHER): Payer: Medicare Other

## 2022-07-22 DIAGNOSIS — Z96611 Presence of right artificial shoulder joint: Secondary | ICD-10-CM | POA: Diagnosis not present

## 2022-07-22 DIAGNOSIS — M25511 Pain in right shoulder: Secondary | ICD-10-CM

## 2022-07-22 DIAGNOSIS — M19011 Primary osteoarthritis, right shoulder: Secondary | ICD-10-CM

## 2022-07-23 ENCOUNTER — Encounter: Payer: Self-pay | Admitting: Surgical

## 2022-07-23 NOTE — Progress Notes (Signed)
   Post-Op Visit Note   Patient: Elizabeth [name]           Date of Birth: Jul 10, 1951           MRN: [id number] Visit Date: 07/22/2022 PCP: Teofilo Pod., PA-C   Assessment & Plan:  Chief Complaint:  Chief Complaint  Patient presents with   Right Shoulder - Routine Post Op   Visit Diagnoses:  1. Arthritis of right shoulder region   2. Right shoulder pain, unspecified chronicity   3. S/P reverse total shoulder arthroplasty, right     Plan: Patient is a 71 year old female who presents s/p right reverse shoulder arthroplasty on 07/07/2022.  Doing well overall without any significant pain.  She has been in sling since the procedure and coming out to do physical therapy occupational therapy exercises that were taught to her in the hospital.  No fevers or chills.  No chest pain or shortness of breath.  No drainage from the incision.  Pain is controlled with over-the-counter medications.  On exam, patient has 10 degrees external rotation, 70 degrees abduction, 90 degrees forward flexion.  Incision is healing well without evidence of infection or dehiscence.  Axillary nerve intact with deltoid firing.  Intact EPL, FPL, finger abduction, grip strength testing, pronation/supination, bicep, tricep, deltoid.  Decent subscapularis strength.  Plan is to continue with home exercise program and start formal physical therapy near her house in [place].  Radiographs of the right shoulder taken today demonstrate right shoulder reverse shoulder arthroplasty prosthesis in good position and alignment without any complicating features.  Follow-up in 4 weeks for clinical recheck with Dr. Marlou Sa.  Follow-Up Instructions: No follow-ups on file.   Orders:  Orders Placed This Encounter  Procedures   XR Shoulder Right   No orders of the defined types were placed in this encounter.   Imaging: No results found.  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 Not current per pt    No family history on file.  Past Surgical History:  Procedure Laterality Date   COLONOSCOPY  2020   REVERSE SHOULDER ARTHROPLASTY Right 07/07/2022   Procedure: RIGHT REVERSE SHOULDER ARTHROPLASTY;  Surgeon: Meredith Pel, MD;  Location: [facility];  Service: Orthopedics;  Laterality: Right;   Social History   Occupational History   Not on file  Tobacco Use   Smoking status: Never   Smokeless tobacco: Never  Vaping Use   Vaping Use: Never used  Substance and Sexual Activity   Alcohol use: Never   Drug use: Never   Sexual activity: Not on file

## 2022-07-31 DIAGNOSIS — M19011 Primary osteoarthritis, right shoulder: Secondary | ICD-10-CM

## 2022-08-19 ENCOUNTER — Ambulatory Visit: Payer: Medicare Other | Admitting: Orthopedic Surgery

## 2022-08-24 ENCOUNTER — Ambulatory Visit (INDEPENDENT_AMBULATORY_CARE_PROVIDER_SITE_OTHER): Payer: Medicare Other | Admitting: Orthopedic Surgery

## 2022-08-24 DIAGNOSIS — M19011 Primary osteoarthritis, right shoulder: Secondary | ICD-10-CM

## 2022-08-24 DIAGNOSIS — Z96611 Presence of right artificial shoulder joint: Secondary | ICD-10-CM

## 2022-08-25 ENCOUNTER — Encounter: Payer: Self-pay | Admitting: Orthopedic Surgery

## 2022-08-25 NOTE — Progress Notes (Signed)
   Post-Op Visit Note   Patient: Elizabeth [name]           Date of Birth: 13-Jun-1951           MRN: [id number] Visit Date: 08/24/2022 PCP: Teofilo Pod., PA-C   Assessment & Plan:  Chief Complaint:  Chief Complaint  Patient presents with   Right Shoulder - Routine Post Op   Visit Diagnoses: No diagnosis found.  Plan: Patient presents for follow-up of right reverse shoulder replacement done 6 weeks ago.  Doing well overall.  Does physical therapy twice a week.  Denies having a lot of pain.  Patient is using the rope pulley at home.  On examination patient has forward flexion and abduction both above 90 degrees with well-healed incision and good external rotation and internal rotation strength.  Plan at this time is activity as tolerated with no lifting more than 20 pounds.  Follow-up as needed.  Need for dental prophylaxis prior to any procedures for the next 12 months discussed.  Follow-Up Instructions: No follow-ups on file.   Orders:  No orders of the defined types were placed in this encounter.  No orders of the defined types were placed in this encounter.   Imaging: No results found.  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 Not current per pt    No family history on file.  Past Surgical History:  Procedure Laterality Date   COLONOSCOPY  2020   REVERSE SHOULDER ARTHROPLASTY Right 07/07/2022   Procedure: RIGHT REVERSE SHOULDER ARTHROPLASTY;  Surgeon: Meredith Pel, MD;  Location: [facility];  Service: Orthopedics;  Laterality: Right;   Social History   Occupational History   Not on file  Tobacco Use   Smoking status: Never   Smokeless tobacco: Never  Vaping Use    Vaping Use: Never used  Substance and Sexual Activity   Alcohol use: Never   Drug use: Never   Sexual activity: Not on file

## 2022-09-09 ENCOUNTER — Telehealth: Payer: Self-pay | Admitting: Orthopedic Surgery

## 2022-09-09 NOTE — Telephone Encounter (Signed)
Elizabeth [name] (PT) called from [facility] called requesting a call back with an updated proto call for pt physical therapy. If pt needs to be doing anything different. Please call Elizabeth [name] at [phone].

## 2022-09-09 NOTE — Telephone Encounter (Signed)
Talked with Rollene Fare at [facility] and provided her with a verbal updated protocol for PT, per Dr. Randel Pigg last office note.

## 2022-09-27 NOTE — Telephone Encounter (Signed)
Mekinze from [facility] would like updated protocol for patient please fax to [phone]

## 2022-10-05 NOTE — Telephone Encounter (Signed)
LMVM advising 

## 2022-10-05 NOTE — Telephone Encounter (Signed)
At this point, I think therapy protocol consisting of full active and passive range of motion and deltoid isometric strengthening with no lifting more than 20 pounds would be okay.

## 2022-10-06 NOTE — Telephone Encounter (Signed)
thx

## 2022-12-06 ENCOUNTER — Telehealth: Payer: Self-pay | Admitting: Orthopedic Surgery

## 2022-12-06 MED ORDER — AMOXICILLIN 500 MG PO TABS: ORAL_TABLET | ORAL | 0 refills | Status: AC

## 2022-12-06 NOTE — Telephone Encounter (Signed)
Rx submitted to pharmacy.

## 2022-12-06 NOTE — Telephone Encounter (Signed)
Patient notified

## 2022-12-06 NOTE — Telephone Encounter (Signed)
Pt states she has an upcoming dental appt 2/9 and need antibiotics pills. Please send script to [facility]. Pt phone number is [phone].

## 2022-12-07 ENCOUNTER — Telehealth: Payer: Self-pay | Admitting: Orthopedic Surgery

## 2022-12-07 NOTE — Telephone Encounter (Signed)
Okay for passive and active range of motion shoulder.  No forceful range of motion behind her back.  No lifting more than 20 pounds.  Okay for deltoid isometric strengthening and some rotator cuff strengthening but she may not be able to do much of this due to her history of rotator cuff arthropathy preoperatively.

## 2022-12-07 NOTE — Telephone Encounter (Signed)
Faxed

## 2022-12-07 NOTE — Telephone Encounter (Signed)
[facility] called in requesting updated protocol for patient please advise fax is [phone]

## 2023-01-04 ENCOUNTER — Telehealth: Payer: Self-pay

## 2023-01-04 NOTE — Telephone Encounter (Signed)
Tam[facility], PT called wanting to know if order for continuation of therapy had been signed and returned.  Cb# [phone], fax# [phone].  Please advise.  Thank you.

## 2023-08-15 IMAGING — CT CT SHOULDER*R* W/O CM
1 of 2 series · 9 of 14 positions shown, 12 images · non-contrast
Comparison: Radiographs 08/18/2021

CLINICAL DATA: Chronic shoulder pain. Preop for total right
shoulder arthroplasty.

EXAM:
CT OF THE UPPER RIGHT EXTREMITY WITHOUT CONTRAST
TECHNIQUE: Multidetector CT imaging of the upper right extremity was performed
according to the standard protocol.

[Series 5: thin soft · axial · 0.55mm/px · z∈[-239,-81]mm · 9 of 329 slices shown, 12 images]
[im 33/329  soft-tissue]
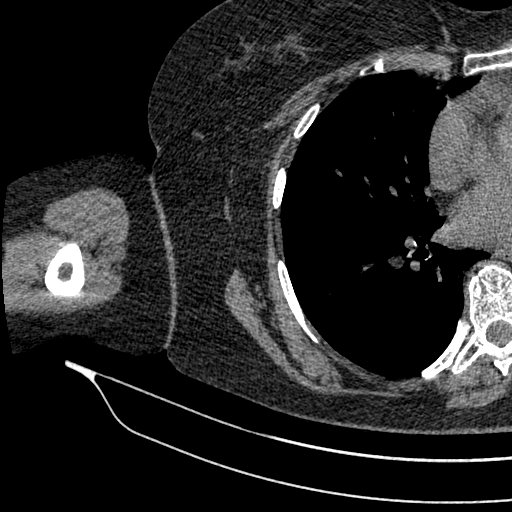
[im 33/329  bone]
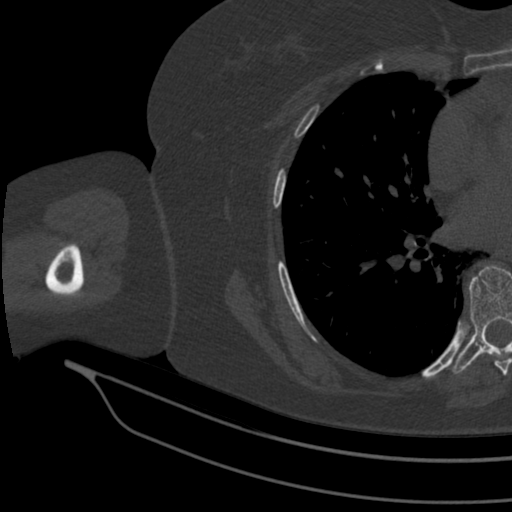
[im 66/329  bone]
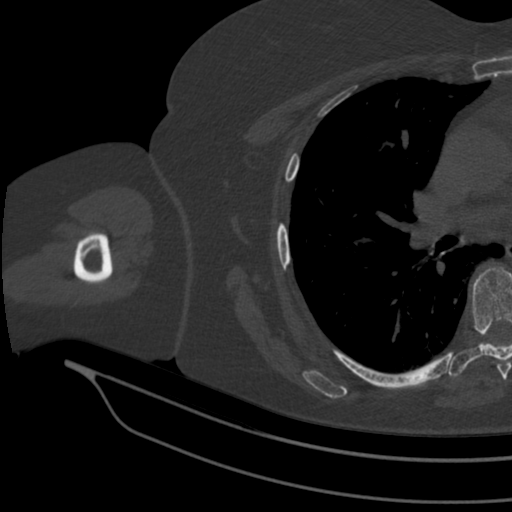
[im 99/329  bone]
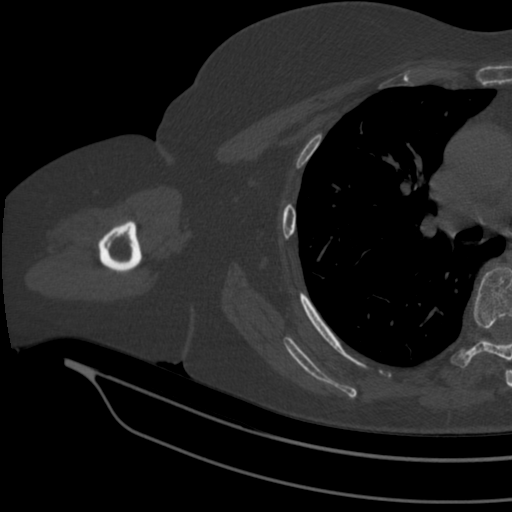
[im 132/329  bone]
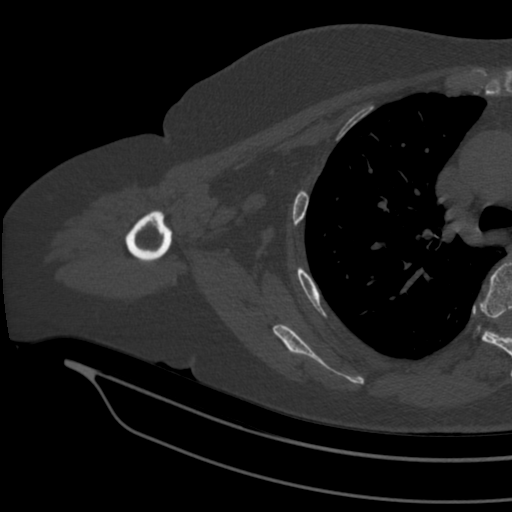
[im 165/329  soft-tissue]
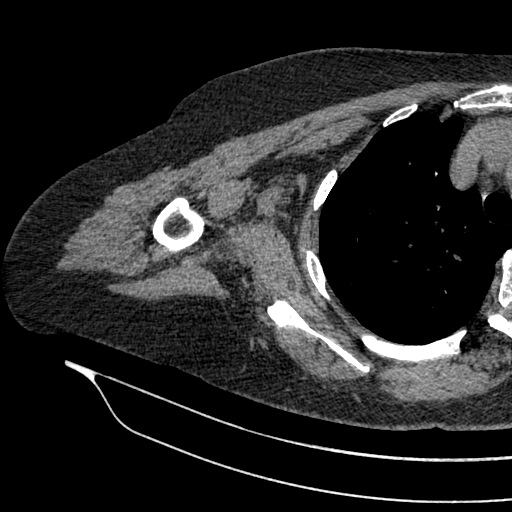
[im 165/329  bone]
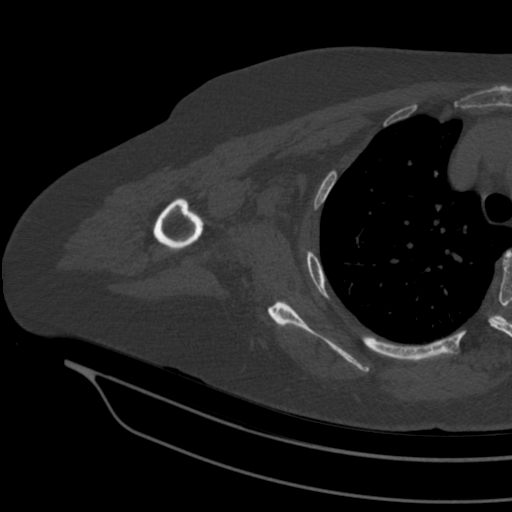
[im 197/329  bone]
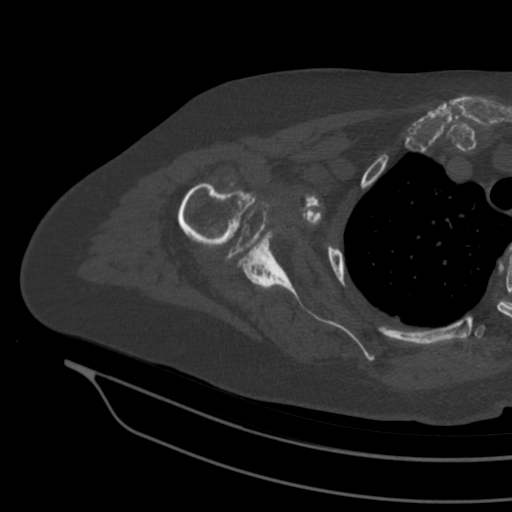
[im 230/329  bone]
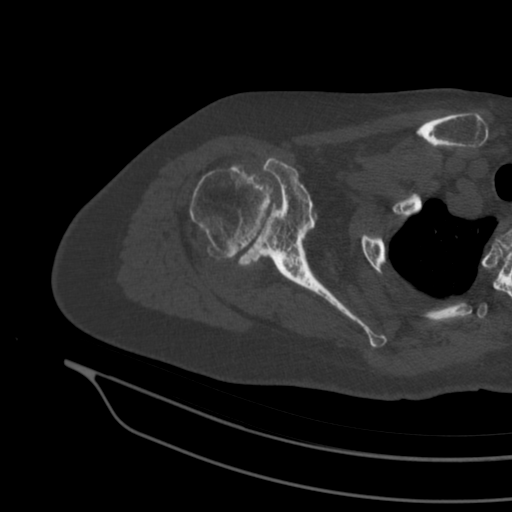
[im 263/329  bone]
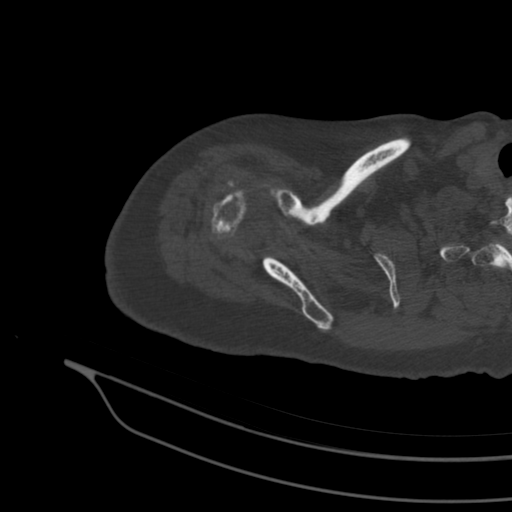
[im 296/329  soft-tissue]
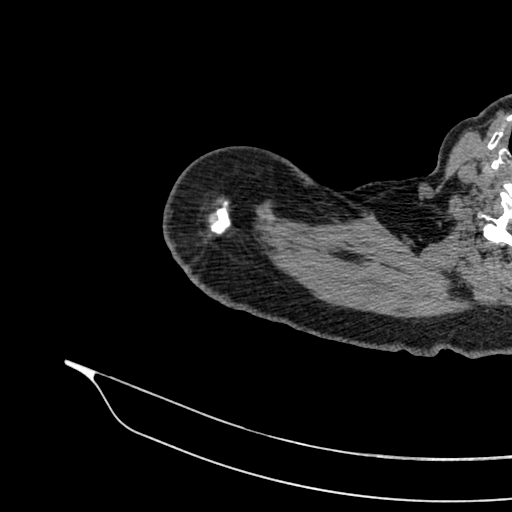
[im 296/329  bone]
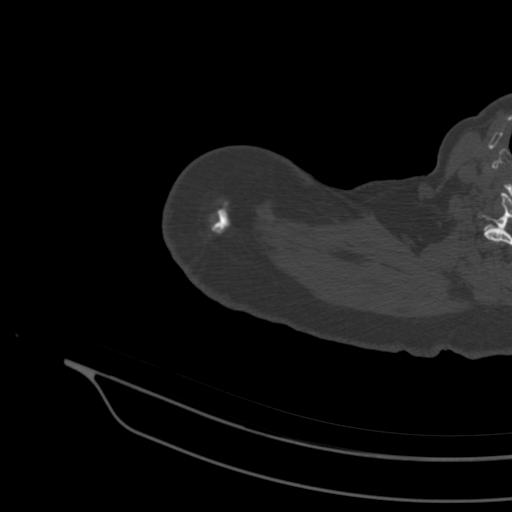

[9 of 14 positions shown; findings below may reference images not displayed]

FINDINGS: Severe/advanced glenohumeral joint degenerative changes with
full-thickness cartilage loss, joint space narrowing, osteophytic
spurring, bony eburnation and subchondral cystic change. No fracture
or AVN. Slight widening and thinning of the glenoid.

The AC joint is intact. Moderate degenerative changes. There is also
significant subacromial spurring.

Areas of focal narrowing of the humeroacromial space and probable
full-thickness retracted supraspinatus and subscapularis tendon
tears with fatty atrophy of the muscles. Calcifications in the
subacromial space could be due to calcific bursitis or some residual
areas of calcific tendinopathy. Calcifications also noted in the
subacromial bursa

The scapular body and ribs are intact. The visualized lung is
grossly clear.
IMPRESSION: 1. Severe/advanced glenohumeral joint degenerative changes as
detailed above.
2. Areas of focal narrowing of the humeroacromial space and probable
full-thickness retracted supraspinatus and subscapularis tendon
tears with fatty atrophy of the muscles.
3. Moderate AC joint degenerative changes and significant
subacromial spurring.
